# Patient Record
Sex: Female | Born: 1955 | Race: White | Hispanic: No | Smoking: Former smoker
Health system: Southern US, Community
[De-identification: ages and names within clinical notes are randomized; demographics above are authoritative.]

## PROBLEM LIST (undated history)

## (undated) DIAGNOSIS — D649 Anemia, unspecified: Secondary | ICD-10-CM

## (undated) DIAGNOSIS — J449 Chronic obstructive pulmonary disease, unspecified: Secondary | ICD-10-CM

## (undated) DIAGNOSIS — R195 Other fecal abnormalities: Secondary | ICD-10-CM

## (undated) DIAGNOSIS — N3281 Overactive bladder: Secondary | ICD-10-CM

## (undated) DIAGNOSIS — Z9889 Other specified postprocedural states: Secondary | ICD-10-CM

## (undated) DIAGNOSIS — K589 Irritable bowel syndrome without diarrhea: Secondary | ICD-10-CM

## (undated) DIAGNOSIS — Z8719 Personal history of other diseases of the digestive system: Secondary | ICD-10-CM

## (undated) DIAGNOSIS — K219 Gastro-esophageal reflux disease without esophagitis: Secondary | ICD-10-CM

## (undated) DIAGNOSIS — R112 Nausea with vomiting, unspecified: Secondary | ICD-10-CM

## (undated) DIAGNOSIS — E041 Nontoxic single thyroid nodule: Secondary | ICD-10-CM

## (undated) DIAGNOSIS — Z87891 Personal history of nicotine dependence: Secondary | ICD-10-CM

## (undated) HISTORY — PX: SPINE SURGERY: SHX786

---

## 2011-11-14 ENCOUNTER — Ambulatory Visit: Payer: Self-pay | Admitting: Family Medicine

## 2014-09-27 ENCOUNTER — Ambulatory Visit: Payer: Self-pay | Admitting: Physician Assistant

## 2014-12-05 ENCOUNTER — Ambulatory Visit: Payer: Self-pay | Admitting: Internal Medicine

## 2016-02-13 ENCOUNTER — Other Ambulatory Visit: Payer: Self-pay | Admitting: Nurse Practitioner

## 2016-02-13 DIAGNOSIS — Z1231 Encounter for screening mammogram for malignant neoplasm of breast: Secondary | ICD-10-CM

## 2016-02-13 DIAGNOSIS — R1011 Right upper quadrant pain: Secondary | ICD-10-CM

## 2016-02-28 ENCOUNTER — Ambulatory Visit
Admission: RE | Admit: 2016-02-28 | Discharge: 2016-02-28 | Disposition: A | Discharge status: Home / Self Care | Payer: BLUE CROSS/BLUE SHIELD | Source: Ambulatory Visit | Attending: Nurse Practitioner | Admitting: Nurse Practitioner

## 2016-02-28 DIAGNOSIS — Z1231 Encounter for screening mammogram for malignant neoplasm of breast: Secondary | ICD-10-CM | POA: Diagnosis not present

## 2016-02-28 DIAGNOSIS — R1011 Right upper quadrant pain: Secondary | ICD-10-CM | POA: Diagnosis present

## 2016-02-28 DIAGNOSIS — K8020 Calculus of gallbladder without cholecystitis without obstruction: Secondary | ICD-10-CM | POA: Insufficient documentation

## 2016-03-21 ENCOUNTER — Encounter: Payer: Self-pay | Admitting: *Deleted

## 2016-03-21 ENCOUNTER — Other Ambulatory Visit: Payer: BLUE CROSS/BLUE SHIELD

## 2016-03-21 NOTE — Patient Instructions (Signed)
  Your procedure is scheduled on: 03-30-16 Report to Same Day Surgery 2nd floor medical mall To find out your arrival time please call [phone] between 1PM - 3PM on 03-29-16  Remember: Instructions that are not followed completely may result in serious medical risk, up to and including death, or upon the discretion of your surgeon and anesthesiologist your surgery may need to be rescheduled.    _x___ 1. Do not eat food or drink liquids after midnight. No gum chewing or hard candies.     __x__ 2. No Alcohol for 24 hours before or after surgery.   __x__3. No Smoking for 24 prior to surgery.   ____  4. Bring all medications with you on the day of surgery if instructed.    __x__ 5. Notify your doctor if there is any change in your medical condition     (cold, fever, infections).     Do not wear jewelry, make-up, hairpins, clips or nail polish.  Do not wear lotions, powders, or perfumes. You may wear deodorant.  Do not shave 48 hours prior to surgery. Men may shave face and neck.  Do not bring valuables to the hospital.    [facility] is not responsible for any belongings or valuables.               Contacts, dentures or bridgework may not be worn into surgery.  Leave your suitcase in the car. After surgery it may be brought to your room.  For patients admitted to the hospital, discharge time is determined by your treatment team.   Patients discharged the day of surgery will not be allowed to drive home.    Please read over the following fact sheets that you were given:   [facility] Pre[facility] and or MRSA Information   _x___ Take these medicines the morning of surgery with A SIP OF WATER:    1. PEPCID  2.  3.  4.  5.  6.  ____ Fleet Enema (as directed)   _x___ Use CHG Soap or sage wipes as directed on instruction sheet   ____ Use inhalers on the day of surgery and bring to hospital day of surgery  ____ Stop metformin 2 days prior to surgery    ____ Take 1/2  of usual insulin dose the night before surgery and none on the morning of surgery.   ____ Stop aspirin or coumadin, or plavix  _x__ Stop Anti-inflammatories such as Advil, Aleve, Ibuprofen, Motrin, Naproxen,          Naprosyn, Goodies powders or aspirin products. Ok to take Tylenol.   ____ Stop supplements until after surgery.    ____ Bring C-Pap to the hospital.

## 2016-03-30 ENCOUNTER — Encounter: Payer: Self-pay | Admitting: *Deleted

## 2016-03-30 ENCOUNTER — Ambulatory Visit: Payer: BLUE CROSS/BLUE SHIELD | Admitting: Anesthesiology

## 2016-03-30 ENCOUNTER — Ambulatory Visit: Payer: BLUE CROSS/BLUE SHIELD

## 2016-03-30 ENCOUNTER — Encounter
Admission: RE | Disposition: A | Discharge status: Home / Self Care | Payer: Self-pay | Source: Ambulatory Visit | Attending: Surgery

## 2016-03-30 ENCOUNTER — Ambulatory Visit
Admission: RE | Admit: 2016-03-30 | Discharge: 2016-03-30 | Disposition: A | Discharge status: Home / Self Care | Payer: BLUE CROSS/BLUE SHIELD | Source: Ambulatory Visit | Attending: Surgery | Admitting: Surgery

## 2016-03-30 DIAGNOSIS — K8010 Calculus of gallbladder with chronic cholecystitis without obstruction: Secondary | ICD-10-CM | POA: Insufficient documentation

## 2016-03-30 DIAGNOSIS — Z87891 Personal history of nicotine dependence: Secondary | ICD-10-CM | POA: Insufficient documentation

## 2016-03-30 DIAGNOSIS — J449 Chronic obstructive pulmonary disease, unspecified: Secondary | ICD-10-CM | POA: Insufficient documentation

## 2016-03-30 DIAGNOSIS — K589 Irritable bowel syndrome without diarrhea: Secondary | ICD-10-CM | POA: Diagnosis not present

## 2016-03-30 DIAGNOSIS — K8020 Calculus of gallbladder without cholecystitis without obstruction: Secondary | ICD-10-CM

## 2016-03-30 DIAGNOSIS — G709 Myoneural disorder, unspecified: Secondary | ICD-10-CM | POA: Insufficient documentation

## 2016-03-30 DIAGNOSIS — K219 Gastro-esophageal reflux disease without esophagitis: Secondary | ICD-10-CM | POA: Insufficient documentation

## 2016-03-30 DIAGNOSIS — Z862 Personal history of diseases of the blood and blood-forming organs and certain disorders involving the immune mechanism: Secondary | ICD-10-CM | POA: Diagnosis not present

## 2016-03-30 HISTORY — DX: Gastro-esophageal reflux disease without esophagitis: K21.9

## 2016-03-30 HISTORY — PX: CHOLECYSTECTOMY: SHX55

## 2016-03-30 HISTORY — DX: Anemia, unspecified: D64.9

## 2016-03-30 HISTORY — DX: Irritable bowel syndrome, unspecified: K58.9

## 2016-03-30 HISTORY — DX: Overactive bladder: N32.81

## 2016-03-30 SURGERY — LAPAROSCOPIC CHOLECYSTECTOMY WITH INTRAOPERATIVE CHOLANGIOGRAM
Anesthesia: General | Site: Abdomen | Wound class: Clean Contaminated

## 2016-03-30 MED ORDER — FENTANYL CITRATE (PF) 100 MCG/2ML IJ SOLN
INTRAMUSCULAR | Status: DC | PRN
  Administered 2016-03-30: 100 ug via INTRAVENOUS
  Administered 2016-03-30: 50 ug via INTRAVENOUS

## 2016-03-30 MED ORDER — LIDOCAINE HCL (CARDIAC) 20 MG/ML IV SOLN
INTRAVENOUS | Status: DC | PRN
  Administered 2016-03-30: 60 mg via INTRAVENOUS

## 2016-03-30 MED ORDER — ROCURONIUM BROMIDE 100 MG/10ML IV SOLN
INTRAVENOUS | Status: DC | PRN
  Administered 2016-03-30: 40 mg via INTRAVENOUS

## 2016-03-30 MED ORDER — ONDANSETRON HCL 4 MG/2ML IJ SOLN
INTRAMUSCULAR | Status: DC | PRN
  Administered 2016-03-30: 4 mg via INTRAVENOUS

## 2016-03-30 MED ORDER — PHENYLEPHRINE HCL 10 MG/ML IJ SOLN
INTRAMUSCULAR | Status: DC | PRN
  Administered 2016-03-30: 100 ug via INTRAVENOUS
  Administered 2016-03-30: 50 ug via INTRAVENOUS
  Administered 2016-03-30 (×4): 100 ug via INTRAVENOUS

## 2016-03-30 MED ORDER — HYDROCODONE-ACETAMINOPHEN 5-325 MG PO TABS: 1.0000 | ORAL_TABLET | ORAL | Status: DC | PRN

## 2016-03-30 MED ORDER — HYDROCODONE-ACETAMINOPHEN 5-325 MG PO TABS
ORAL_TABLET | ORAL | Status: DC
Start: 2016-03-30 — End: 2016-03-30
  Filled 2016-03-30: qty 2

## 2016-03-30 MED ORDER — KETOROLAC TROMETHAMINE 30 MG/ML IJ SOLN
INTRAMUSCULAR | Status: DC | PRN
  Administered 2016-03-30: 30 mg via INTRAVENOUS

## 2016-03-30 MED ORDER — ONDANSETRON HCL 4 MG/2ML IJ SOLN
INTRAMUSCULAR | Status: AC
  Filled 2016-03-30: qty 2

## 2016-03-30 MED ORDER — LACTATED RINGERS IV SOLN
INTRAVENOUS | Status: DC
  Administered 2016-03-30 (×2): via INTRAVENOUS

## 2016-03-30 MED ORDER — MIDAZOLAM HCL 5 MG/5ML IJ SOLN
INTRAMUSCULAR | Status: DC | PRN
  Administered 2016-03-30: 2 mg via INTRAVENOUS

## 2016-03-30 MED ORDER — PROPOFOL 10 MG/ML IV BOLUS
INTRAVENOUS | Status: DC | PRN
  Administered 2016-03-30: 150 mg via INTRAVENOUS

## 2016-03-30 MED ORDER — FENTANYL CITRATE (PF) 100 MCG/2ML IJ SOLN
25.0000 ug | INTRAMUSCULAR | Status: DC | PRN
  Administered 2016-03-30 (×4): 25 ug via INTRAVENOUS

## 2016-03-30 MED ORDER — HEPARIN SODIUM (PORCINE) 5000 UNIT/ML IJ SOLN
INTRAMUSCULAR | Status: AC
  Filled 2016-03-30: qty 1

## 2016-03-30 MED ORDER — FENTANYL CITRATE (PF) 100 MCG/2ML IJ SOLN
INTRAMUSCULAR | Status: AC
  Administered 2016-03-30: 25 ug via INTRAVENOUS
  Filled 2016-03-30: qty 2

## 2016-03-30 MED ORDER — ONDANSETRON HCL 4 MG/2ML IJ SOLN
4.0000 mg | Freq: Once | INTRAMUSCULAR | Status: AC | PRN
  Administered 2016-03-30: 4 mg via INTRAVENOUS

## 2016-03-30 MED ORDER — HYDROCODONE-ACETAMINOPHEN 5-325 MG PO TABS
1.0000 | ORAL_TABLET | ORAL | Status: DC | PRN
  Administered 2016-03-30: 2 via ORAL

## 2016-03-30 MED ORDER — SUGAMMADEX SODIUM 200 MG/2ML IV SOLN
INTRAVENOUS | Status: DC | PRN
  Administered 2016-03-30: 120 mg via INTRAVENOUS

## 2016-03-30 MED ORDER — SODIUM CHLORIDE 0.9 % IV SOLN
INTRAVENOUS | Status: DC | PRN
  Administered 2016-03-30: 1 mL via INTRAMUSCULAR

## 2016-03-30 MED ORDER — SODIUM CHLORIDE 0.9 % IV SOLN
INTRAVENOUS | Status: DC | PRN
  Administered 2016-03-30: 14 mL

## 2016-03-30 MED ORDER — DEXAMETHASONE SODIUM PHOSPHATE 10 MG/ML IJ SOLN
INTRAMUSCULAR | Status: DC | PRN
  Administered 2016-03-30: 5 mg via INTRAVENOUS

## 2016-03-30 SURGICAL SUPPLY — 37 items
APPLIER CLIP ROT 10 11.4 M/L (STAPLE) ×3
CANISTER SUCT 1200ML W/VALVE (MISCELLANEOUS) ×3 IMPLANT
CANNULA DILATOR 10 W/SLV (CANNULA) ×2 IMPLANT
CANNULA DILATOR 10MM W/SLV (CANNULA) ×1
CATH REDDICK CHOLANGI 4FR 50CM (CATHETERS) ×3 IMPLANT
CHLORAPREP W/TINT 26ML (MISCELLANEOUS) ×3 IMPLANT
CLIP APPLIE ROT 10 11.4 M/L (STAPLE) ×1 IMPLANT
CLOSURE WOUND 1/2 X4 (GAUZE/BANDAGES/DRESSINGS) ×1
DRAPE SHEET LG 3/4 BI-LAMINATE (DRAPES) ×3 IMPLANT
ELECT REM PT RETURN 9FT ADLT (ELECTROSURGICAL) ×3
ELECTRODE REM PT RTRN 9FT ADLT (ELECTROSURGICAL) ×1 IMPLANT
GAUZE SPONGE 4X4 12PLY STRL (GAUZE/BANDAGES/DRESSINGS) ×3 IMPLANT
GLOVE BIO SURGEON STRL SZ7.5 (GLOVE) ×3 IMPLANT
GOWN STRL REUS W/ TWL LRG LVL3 (GOWN DISPOSABLE) ×4 IMPLANT
GOWN STRL REUS W/TWL LRG LVL3 (GOWN DISPOSABLE) ×8
IRRIGATION STRYKERFLOW (MISCELLANEOUS) ×1 IMPLANT
IRRIGATOR STRYKERFLOW (MISCELLANEOUS) ×3
IV NS 1000ML (IV SOLUTION) ×2
IV NS 1000ML BAXH (IV SOLUTION) ×1 IMPLANT
KIT RM TURNOVER STRD PROC AR (KITS) ×3 IMPLANT
LABEL OR SOLS (LABEL) ×3 IMPLANT
NDL INSUFF ACCESS 14 VERSASTEP (NEEDLE) ×3 IMPLANT
NEEDLE FILTER BLUNT 18X 1/2SAF (NEEDLE) ×2
NEEDLE FILTER BLUNT 18X1 1/2 (NEEDLE) ×1 IMPLANT
NS IRRIG 500ML POUR BTL (IV SOLUTION) ×3 IMPLANT
PACK LAP CHOLECYSTECTOMY (MISCELLANEOUS) ×3 IMPLANT
SCISSORS METZENBAUM CVD 33 (INSTRUMENTS) ×3 IMPLANT
SEAL FOR SCOPE WARMER C3101 (MISCELLANEOUS) ×3 IMPLANT
SLEEVE ENDOPATH XCEL 5M (ENDOMECHANICALS) ×3 IMPLANT
STRIP CLOSURE SKIN 1/2X4 (GAUZE/BANDAGES/DRESSINGS) ×2 IMPLANT
SUT CHROMIC 5 0 RB 1 27 (SUTURE) ×3 IMPLANT
SUT VIC AB 0 CT2 27 (SUTURE) IMPLANT
SYR 3ML LL SCALE MARK (SYRINGE) ×3 IMPLANT
TROCAR XCEL NON-BLD 11X100MML (ENDOMECHANICALS) ×3 IMPLANT
TROCAR XCEL NON-BLD 5MMX100MML (ENDOMECHANICALS) ×3 IMPLANT
TUBING INSUFFLATOR HI FLOW (MISCELLANEOUS) ×3 IMPLANT
WATER STERILE IRR 1000ML POUR (IV SOLUTION) ×3 IMPLANT

## 2016-03-30 NOTE — H&P (Signed)
  She reports no change in condition since the day of the office exam.  Lab work was reviewed.  I discussed her history of epigastric pain and findings of gallstones. Plan is for laparoscopic cholecystectomy

## 2016-03-30 NOTE — Op Note (Signed)
OPERATIVE REPORT  PREOPERATIVE DIAGNOSIS:  Chronic cholecystitis cholelithiasis  POSTOPERATIVE DIAGNOSIS: Chronic cholecystitis cholelithiasis  PROCEDURE: Laparoscopic cholecystectomy with cholangiogram  ANESTHESIA: General  SURGEON: Renda RollsWilton Smith M.D.  INDICATIONS: She has history of epigastric pains and ultrasound findings of gallstones. Surgery was recommended for definitive treatment.    With the patient on the operating table in the supine position under general endotracheal anesthesia the abdomen was prepared with ChloraPrep solution and draped in a sterile manner. A short incision was made in the inferior aspect of the umbilicus and carried down to the deep fascia which was grasped with a laryngeal hook. A Veress needle was inserted aspirated and irrigated with a saline solution. The peritoneal cavity was insufflated with carbon dioxide. The Veress needle was removed. The 10 mm cannula was inserted. The 10 mm 0 laparoscope was inserted to view the peritoneal cavity.  Another incision was made in the epigastrium slightly to the right of the midline to introduce an 11 mm cannula. 2 incisions were made in the lateral aspect of the right upper quadrant to introduce 2   5 mm cannulas.  The gallbladder was retracted towards the right shoulder. Multiple adhesions were taken down with blunt and sharp dissection.  The gallbladder neck was retracted inferiorly and laterally.  The porta hepatis was identified. The gallbladder was mobilized with incision of the visceral peritoneum. The cystic duct was dissected free from surrounding structures. The cystic artery was dissected free from surrounding structures. A critical view of safety was demonstrated  An Endo Clip was placed across the cystic duct adjacent to the gallbladder neck. An incision was made in the cystic duct to introduce a Reddick catheter. The cholangiogram was done with injection of half-strength Conray 60 dye. This demonstrated the bile  ducts and flow of dye into the duodenum. No retained stones were identified. The cholangiogram appeared normal. The Reddick catheter was removed. The cystic duct was doubly ligated with endoclips and divided. The cystic artery was controlled with double endoclips and divided. The gallbladder was dissected free from the liver with use of hook and cautery and blunt dissection. Bleeding was minimal and hemostasis was intact. The gallbladder was delivered up through the infraumbilical incision opened and suctioned and removed. There were several small palpable stones. The gallbladder was submitted in formalin for routine pathology.The skin incisions were closed with interrupted 5-0 chromic subcutaneous suture benzoin and Steri-Strips. Gauze dressings were applied with paper tape.  The patient appeared to be in satisfactory condition and was prepared for transfer to the recovery room  Renda RollsWilton Smith M.D.

## 2016-03-30 NOTE — Transfer of Care (Signed)
Immediate Anesthesia Transfer of Care Note  Patient: Adriana Howard  Procedure(s) Performed: Procedure(s): LAPAROSCOPIC CHOLECYSTECTOMY WITH INTRAOPERATIVE CHOLANGIOGRAM (N/A)  Patient Location: PACU  Anesthesia Type:General  Level of Consciousness: sedated  Airway & Oxygen Therapy: Patient Spontanous Breathing and Patient connected to face mask oxygen  Post-op Assessment: Report given to RN  Post vital signs: Reviewed and stable  Last Vitals:  Filed Vitals:   03/30/16 1443 03/30/16 1740  BP: 135/89 112/63  Pulse: 79 77  Temp: 36.7 C 36.8 C  Resp: 18 16    Last Pain:  Filed Vitals:   03/30/16 1741  PainSc: 0-No pain         Complications: No apparent anesthesia complications

## 2016-03-30 NOTE — Discharge Instructions (Signed)
Take Tylenol or Norco if needed for pain.  Remove dressings Saturday. May shower Sunday.  Avoid straining and heavy lifting during the first week after surgery.  Gradually resume usual activities.   AMBULATORY SURGERY  DISCHARGE INSTRUCTIONS   1) The drugs that you were given will stay in your system until tomorrow so for the next 24 hours you should not:  A) Drive an automobile B) Make any legal decisions C) Drink any alcoholic beverage   2) You may resume regular meals tomorrow.  Today it is better to start with liquids and gradually work up to solid foods.  You may eat anything you prefer, but it is better to start with liquids, then soup and crackers, and gradually work up to solid foods.   3) Please notify your doctor immediately if you have any unusual bleeding, trouble breathing, redness and pain at the surgery site, drainage, fever, or pain not relieved by medication.    4) Additional Instructions:        Please contact your physician with any problems or Same Day Surgery at [phone], Monday through Friday 6 am to 4 pm, or [place] at [facility] Main number at [phone].

## 2016-03-30 NOTE — Anesthesia Procedure Notes (Signed)
Procedure Name: Intubation Date/Time: 03/30/2016 4:14 PM Performed by: Lily KocherPERALTA, Santa Abdelrahman Pre-anesthesia Checklist: Patient identified, Patient being monitored, Timeout performed, Emergency Drugs available and Suction available Patient Re-evaluated:Patient Re-evaluated prior to inductionOxygen Delivery Method: Circle system utilized Preoxygenation: Pre-oxygenation with 100% oxygen Intubation Type: IV induction Ventilation: Mask ventilation without difficulty Laryngoscope Size: Mac and 3 Grade View: Grade I Tube type: Oral Tube size: 7.0 mm Number of attempts: 1 Airway Equipment and Method: Stylet Placement Confirmation: ETT inserted through vocal cords under direct vision,  positive ETCO2 and breath sounds checked- equal and bilateral Secured at: 20 cm Tube secured with: Tape Dental Injury: Teeth and Oropharynx as per pre-operative assessment

## 2016-03-30 NOTE — Anesthesia Postprocedure Evaluation (Signed)
Anesthesia Post Note  Patient: Adriana Howard  Procedure(s) Performed: Procedure(s) (LRB): LAPAROSCOPIC CHOLECYSTECTOMY WITH INTRAOPERATIVE CHOLANGIOGRAM (N/A)  Patient location during evaluation: PACU Anesthesia Type: General Level of consciousness: awake and alert Pain management: pain level controlled Vital Signs Assessment: post-procedure vital signs reviewed and stable Respiratory status: spontaneous breathing, nonlabored ventilation, respiratory function stable and patient connected to nasal cannula oxygen Cardiovascular status: blood pressure returned to baseline and stable Postop Assessment: no signs of nausea or vomiting Anesthetic complications: no    Last Vitals:  Filed Vitals:   03/30/16 1838 03/30/16 1901  BP: 114/77 108/78  Pulse: 75 77  Temp: 36.8 C 36.9 C  Resp: 18 16    Last Pain:  Filed Vitals:   03/30/16 1902  PainSc: 3                  Cleda MccreedyJoseph K Piscitello

## 2016-03-30 NOTE — Anesthesia Preprocedure Evaluation (Addendum)
Anesthesia Evaluation  Patient identified by MRN, date of birth, ID band Patient awake    Reviewed: Allergy & Precautions, NPO status , Patient's Chart, lab work & pertinent test results, reviewed documented beta blocker date and time   Airway Mallampati: III  TM Distance: <3 FB Neck ROM: limited    Dental  (+) Chipped   Pulmonary COPD, former smoker,           Cardiovascular      Neuro/Psych  Neuromuscular disease    GI/Hepatic GERD  Controlled,  Endo/Other    Renal/GU      Musculoskeletal   Abdominal   Peds  Hematology  (+) anemia ,   Anesthesia Other Findings Past Medical History:   GERD (gastroesophageal reflux disease)                       Overactive bladder                                           IBS (irritable bowel syndrome)                               Anemia                                                         Comment: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     Plan Discussed with: CRNA  Anesthesia Plan Comments:       Anesthesia Quick Evaluation

## 2016-04-02 ENCOUNTER — Encounter: Payer: Self-pay | Admitting: Surgery

## 2016-04-04 LAB — SURGICAL PATHOLOGY

## 2016-05-06 ENCOUNTER — Emergency Department: Payer: BLUE CROSS/BLUE SHIELD

## 2016-05-06 ENCOUNTER — Encounter: Payer: Self-pay | Admitting: Emergency Medicine

## 2016-05-06 ENCOUNTER — Emergency Department
Admission: EM | Admit: 2016-05-06 | Discharge: 2016-05-07 | Disposition: A | Discharge status: Home / Self Care | Payer: BLUE CROSS/BLUE SHIELD | Attending: Emergency Medicine | Admitting: Emergency Medicine

## 2016-05-06 DIAGNOSIS — Z79899 Other long term (current) drug therapy: Secondary | ICD-10-CM | POA: Diagnosis not present

## 2016-05-06 DIAGNOSIS — R112 Nausea with vomiting, unspecified: Secondary | ICD-10-CM | POA: Insufficient documentation

## 2016-05-06 DIAGNOSIS — R52 Pain, unspecified: Secondary | ICD-10-CM

## 2016-05-06 DIAGNOSIS — R1030 Lower abdominal pain, unspecified: Secondary | ICD-10-CM | POA: Diagnosis not present

## 2016-05-06 DIAGNOSIS — Z87891 Personal history of nicotine dependence: Secondary | ICD-10-CM | POA: Diagnosis not present

## 2016-05-06 LAB — COMPREHENSIVE METABOLIC PANEL
ALK PHOS: 84 U/L (ref 38–126)
ALT: 13 U/L — ABNORMAL LOW (ref 14–54)
ANION GAP: 7 (ref 5–15)
AST: 17 U/L (ref 15–41)
Albumin: 4.1 g/dL (ref 3.5–5.0)
BILIRUBIN TOTAL: 0.4 mg/dL (ref 0.3–1.2)
BUN: 19 mg/dL (ref 6–20)
CALCIUM: 9 mg/dL (ref 8.9–10.3)
CO2: 25 mmol/L (ref 22–32)
Chloride: 106 mmol/L (ref 101–111)
Creatinine, Ser: 0.95 mg/dL (ref 0.44–1.00)
GFR calc non Af Amer: >60 mL/min (ref >60)
Glucose, Bld: 114 mg/dL — ABNORMAL HIGH (ref 65–99)
POTASSIUM: 3.8 mmol/L (ref 3.5–5.1)
SODIUM: 138 mmol/L (ref 135–145)
TOTAL PROTEIN: 7.4 g/dL (ref 6.5–8.1)

## 2016-05-06 LAB — CBC
HEMATOCRIT: 38 % (ref 35.0–47.0)
HEMOGLOBIN: 13 g/dL (ref 12.0–16.0)
MCH: 31.8 pg (ref 26.0–34.0)
MCHC: 34.2 g/dL (ref 32.0–36.0)
MCV: 92.9 fL (ref 80.0–100.0)
Platelets: 233 10*3/uL (ref 150–440)
RBC: 4.09 MIL/uL (ref 3.80–5.20)
RDW: 14.2 % (ref 11.5–14.5)
WBC: 7.2 10*3/uL (ref 3.6–11.0)

## 2016-05-06 LAB — TROPONIN I

## 2016-05-06 LAB — URINALYSIS COMPLETE WITH MICROSCOPIC (ARMC ONLY)
BILIRUBIN URINE: NEGATIVE
Bacteria, UA: NONE SEEN
GLUCOSE, UA: NEGATIVE mg/dL
Hgb urine dipstick: NEGATIVE
Ketones, ur: NEGATIVE mg/dL
Leukocytes, UA: NEGATIVE
NITRITE: NEGATIVE
Protein, ur: NEGATIVE mg/dL
Specific Gravity, Urine: 1.025 (ref 1.005–1.030)
Squamous Epithelial / LPF: NONE SEEN
pH: 6 (ref 5.0–8.0)

## 2016-05-06 NOTE — ED Provider Notes (Signed)
[facility] Emergency Department Provider Note   ____________________________________________   First MD Initiated Contact with Patient 05/06/16 2106     (approximate)  I have reviewed the triage vital signs and the nursing notes.   HISTORY  Chief Complaint Abdominal Pain and Emesis    HPI Adriana Howard is a 60 y.o. female patient reports she had her gallbladder out about 6 weeks ago. She had heartburn last night and then tonight began having sharp pains suprapubically in the abdomen and vomited once and now feels better. Last week she fell and was feeling okay. Last couple days that she's been developing some low back pain. This low-back pain is achy but not severe.   Past Medical History:  Diagnosis Date  . 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 GERD (gastroesophageal reflux disease)   . IBS (irritable bowel syndrome)   . Overactive bladder     There are no active problems to display for this patient.   Past Surgical History:  Procedure Laterality Date  . CHOLECYSTECTOMY N/A 03/30/2016   Procedure: LAPAROSCOPIC CHOLECYSTECTOMY WITH INTRAOPERATIVE CHOLANGIOGRAM;  Surgeon: Nadeen LandauJarvis Wilton Smith, MD;  Location: [facility] ORS;  Service: General;  Laterality: N/A;  . SPINE SURGERY     to remove a benign tumor    Prior to Admission medications   Medication Sig Start Date End Date Taking? Authorizing Provider  acetaminophen (TYLENOL) 500 MG tablet Take 1,000 mg by mouth every 6 (six) hours as needed for mild pain.   Yes Historical Provider, MD  cholestyramine (QUESTRAN) 4 g packet Take 4 g by mouth daily. 04/18/16  Yes Historical Provider, MD  famotidine (PEPCID) 20 MG tablet Take 20 mg by mouth 2 (two) times daily.   Yes Historical Provider, MD  oxybutynin (DITROPAN) 5 MG tablet Take 5 mg by mouth 3 (three) times daily. 05/26/15 05/25/16 Yes Historical Provider, MD  sucralfate (CARAFATE) 1 g tablet Take 1 g by mouth 3  (three) times daily.   Yes Historical Provider, MD    Allergies Penicillin g  Family History  Problem Relation Age of Onset  . Breast cancer Mother 8660    Social History Social History  Substance Use Topics  . Smoking status: Former Smoker    Packs/day: 1.50    Years: 20.00    Types: Cigarettes    Quit date: 03/21/1986  . Smokeless tobacco: Never Used  . Alcohol use No    Review of Systems Constitutional: No fever/chills Eyes: No visual changes. ENT: No sore throat. Cardiovascular: Denies chest pain. Respiratory: Denies shortness of breath. Gastrointestinal:See history of present illness Genitourinary: Negative for dysuria. Musculoskeletal: See history of present illness. Skin: Negative for rash. Neurological: Negative for headaches, focal weakness or numbness.  10-point ROS otherwise negative.  ____________________________________________   PHYSICAL EXAM:  VITAL SIGNS: ED Triage Vitals  Enc Vitals Group     BP 05/06/16 2044 131/83     Pulse Rate 05/06/16 2044 72     Resp 05/06/16 2044 16     Temp 05/06/16 2044 98.2 F (36.8 C)     Temp Source 05/06/16 2044 Oral     SpO2 05/06/16 2044 100 %     Weight 05/06/16 2044 130 lb (59 kg)     Height 05/06/16 2044 5\' 7"  (1.702 m)     Head Circumference --      Peak Flow --      Pain Score 05/06/16 2045 0  Pain Loc --      Pain Edu? --      Excl. in GC? --     Constitutional: Alert and oriented. Well appearing and in no acute distress. Eyes: Conjunctivae are normal. PERRL. EOMI. Head: Atraumatic. Nose: No congestion/rhinnorhea. Mouth/Throat: Mucous membranes are moist.  Oropharynx non-erythematous. Neck: No stridor. Cardiovascular: Normal rate, regular rhythm. Grossly normal heart sounds.  Good peripheral circulation. Respiratory: Normal respiratory effort.  No retractions. Lungs CTAB. Gastrointestinal: Soft and nontender. No distention. No abdominal bruits. No CVA tenderness. Musculoskeletal: No lower  extremity tenderness nor edema.  No joint effusions. Neurologic:  Normal speech and language. No gross focal neurologic deficits are appreciated. No gait instability. Skin:  Skin is warm, dry and intact. No rash noted. Psychiatric: Mood and affect are normal. Speech and behavior are normal.  ____________________________________________   LABS (all labs ordered are listed, but only abnormal results are displayed)  Labs Reviewed  COMPREHENSIVE METABOLIC PANEL - Abnormal; Notable for the following:       Result Value   Glucose, Bld 114 (*)    ALT 13 (*)    All other components within normal limits  URINALYSIS COMPLETEWITH MICROSCOPIC ([facility] ONLY) - Abnormal; Notable for the following:    Color, Urine YELLOW (*)    APPearance CLEAR (*)    All other components within normal limits  CBC  TROPONIN I   ____________________________________________  EKG   ____________________________________________  RADIOLOGY   ____CLINICAL DATA:  Acute onset of lower abdominal pain and vomiting. Initial encounter.  EXAM: CT ABDOMEN AND PELVIS WITHOUT CONTRAST  TECHNIQUE: Multidetector CT imaging of the abdomen and pelvis was performed following the standard protocol without IV contrast.  COMPARISON:  Right upper quadrant ultrasound performed 02/28/2016  FINDINGS: The visualized lung bases are clear.  The liver and spleen are unremarkable in appearance. The patient is status post cholecystectomy, with clips noted at the gallbladder fossa. The pancreas and adrenal glands are unremarkable.  The kidneys are unremarkable in appearance. There is no evidence of hydronephrosis. No renal or ureteral stones are seen. No perinephric stranding is appreciated.  No free fluid is identified. The small bowel is unremarkable in appearance. The stomach is within normal limits. No acute vascular abnormalities are seen. Mild scattered calcification is seen along the abdominal aorta.  The  appendix is not definitely characterized; there is no evidence for appendicitis. Minimal diverticulosis is noted along the sigmoid colon. The colon is otherwise unremarkable in appearance.  The bladder is mildly distended and grossly unremarkable. The uterus is unremarkable in appearance. The ovaries are grossly symmetric. No suspicious adnexal masses are seen. No inguinal lymphadenopathy is seen. Postoperative change is noted at the left hemipelvis.  No acute osseous abnormalities are identified.  IMPRESSION: 1. No acute abnormality seen within the abdomen or pelvis. 2. Minimal diverticulosis along the sigmoid colon, without evidence of diverticulitis.   Electronically Signed   By: Roanna Raider M.D.   On: 05/06/2016 22:21___  _____CLINICAL DATA:  Crackles at the bilateral lung bases. Patient reports "bad heartburn".  EXAM: CHEST  2 VIEW  COMPARISON:  Chest radiograph 08/28/2014. Included lung bases from CT abdomen/pelvis earlier this day  FINDINGS: Mild hyperinflation. The cardiomediastinal contours are normal. Pulmonary vasculature is normal. No consolidation, pleural effusion, or pneumothorax. No acute osseous abnormalities are seen.  IMPRESSION: Hyperinflation, can be seen with COPD.  No acute process.   Electronically Signed   By: Rubye Oaks M.D.   On: 05/06/2016 22:26________________________________   PROCEDURES  Procedures    ____________________________________________   INITIAL IMPRESSION / ASSESSMENT AND PLAN / ED COURSE  Pertinent labs & imaging results that were available during my care of the patient were reviewed by me and considered in my medical decision making (see chart for details).    Clinical Course   Patient still feels well on discharge  ____________________________________________   FINAL CLINICAL IMPRESSION(S) / ED DIAGNOSES  Final diagnoses:  Pain  Lower abdominal pain      NEW MEDICATIONS  STARTED DURING THIS VISIT:  Discharge Medication List as of 05/07/2016 12:09 AM       Note:  This document was prepared using Dragon voice recognition software and may include unintentional dictation errors.    Arnaldo Natal, MD 05/07/16 [phone]

## 2016-05-06 NOTE — ED Triage Notes (Addendum)
Pt states yesterday had "heartburn", pt states this am she began to have abdominal pain that since this afternoon has localized to lower abd and is accompanied by vomiting and no nausea. Pt states she also has "the feeling like I have to pee and I don't go." pt denies diarrhea and fever.

## 2016-05-07 NOTE — Discharge Instructions (Signed)
Please follow-up with your regular doctor later on this week. Please return if you feel worse again especially for fever or continued vomiting or increased pain.

## 2017-02-18 ENCOUNTER — Other Ambulatory Visit: Payer: Self-pay | Admitting: Nurse Practitioner

## 2017-02-18 DIAGNOSIS — Z1231 Encounter for screening mammogram for malignant neoplasm of breast: Secondary | ICD-10-CM

## 2017-03-11 ENCOUNTER — Ambulatory Visit
Admission: RE | Admit: 2017-03-11 | Discharge: 2017-03-11 | Disposition: A | Discharge status: Home / Self Care | Payer: BLUE CROSS/BLUE SHIELD | Source: Ambulatory Visit | Attending: Nurse Practitioner | Admitting: Nurse Practitioner

## 2017-03-11 DIAGNOSIS — Z1231 Encounter for screening mammogram for malignant neoplasm of breast: Secondary | ICD-10-CM | POA: Diagnosis present

## 2017-10-19 IMAGING — CT CT RENAL STONE PROTOCOL
2 of 4 series · 16 of 46 positions shown, 18 images · non-contrast
Comparison: Right upper quadrant ultrasound performed 02/28/2016

CLINICAL DATA: Acute onset of lower abdominal pain and vomiting.
Initial encounter.

EXAM:
CT ABDOMEN AND PELVIS WITHOUT CONTRAST
TECHNIQUE: Multidetector CT imaging of the abdomen and pelvis was performed
following the standard protocol without IV contrast.

[Series 2: axial st · axial · 0.71mm/px · z∈[-1070,-685]mm · 13 of 85 slices shown, 15 images]
[im 4/85  soft-tissue]
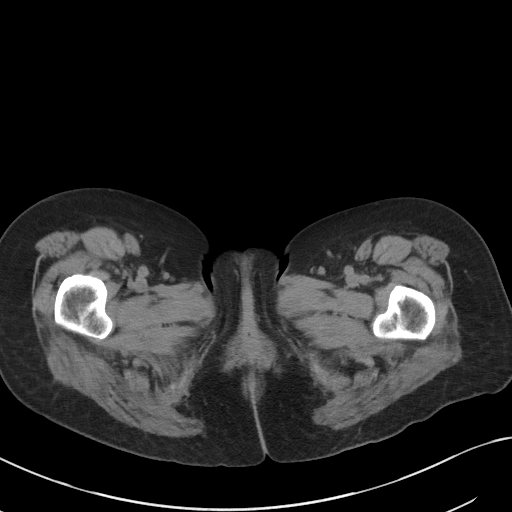
[im 4/85  bone]
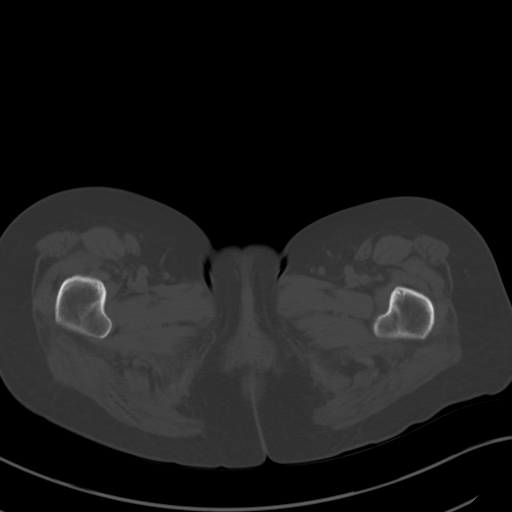
[im 10/85  soft-tissue]
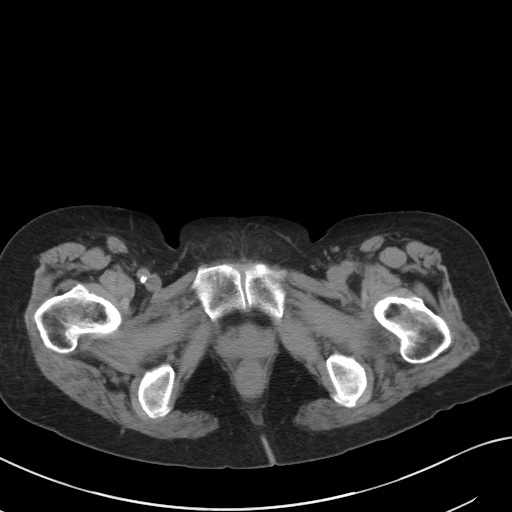
[im 17/85  soft-tissue]
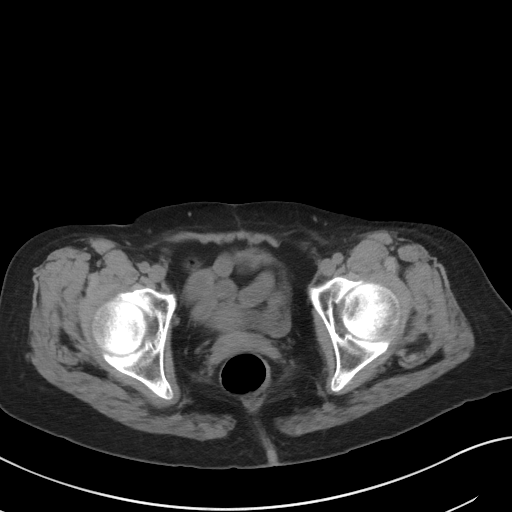
[im 23/85  soft-tissue]
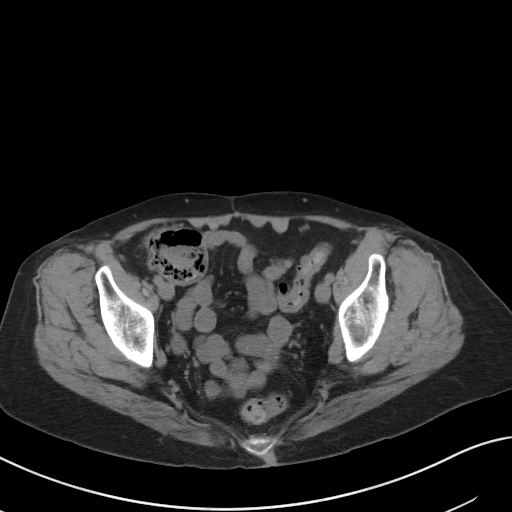
[im 30/85  soft-tissue]
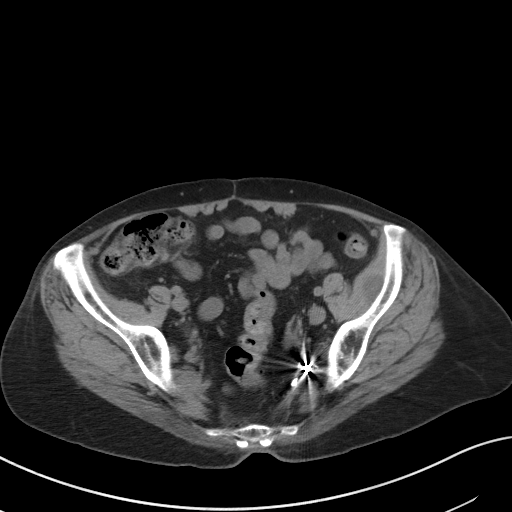
[im 36/85  soft-tissue]
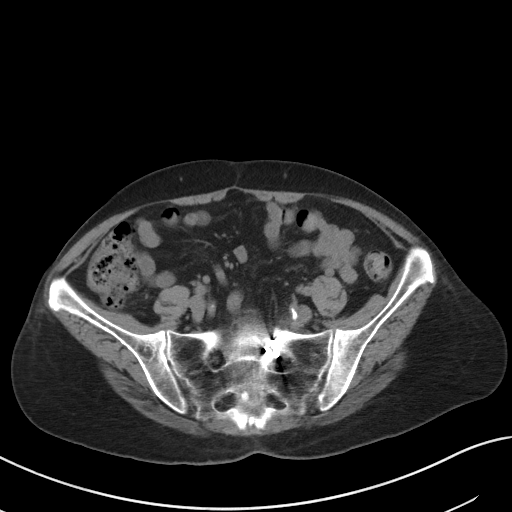
[im 43/85  soft-tissue]
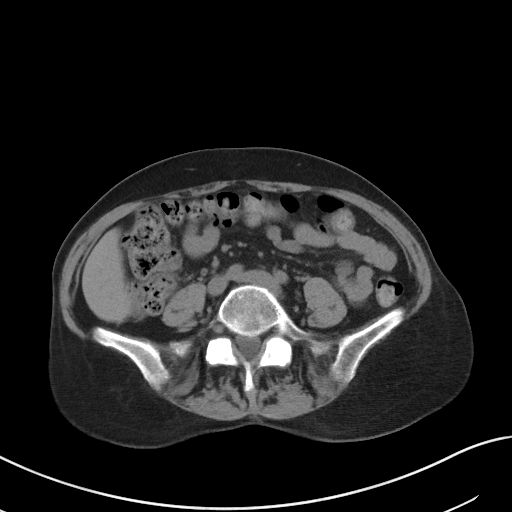
[im 49/85  soft-tissue]
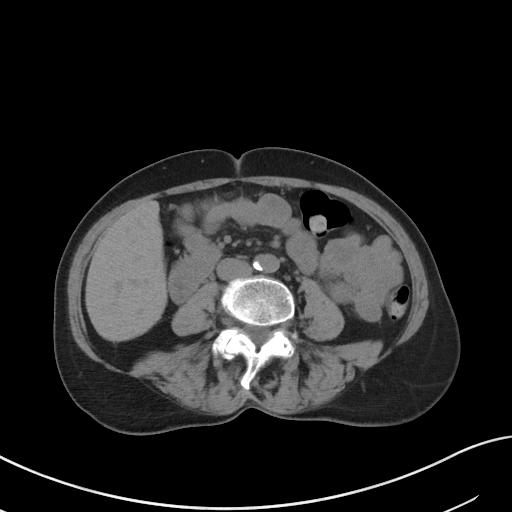
[im 55/85  soft-tissue]
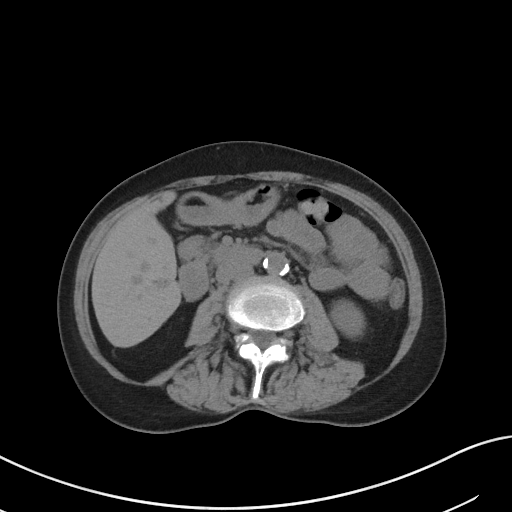
[im 55/85  bone]
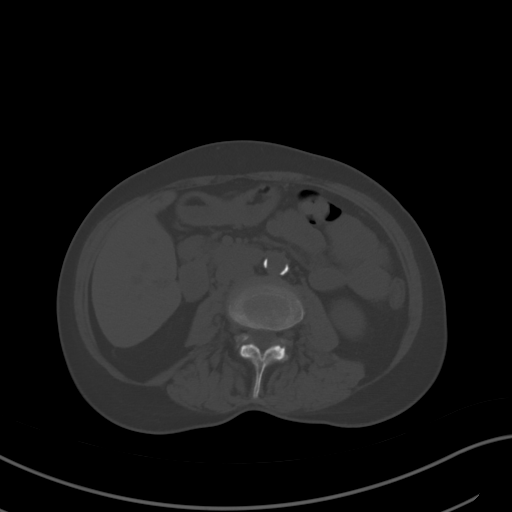
[im 62/85  soft-tissue]
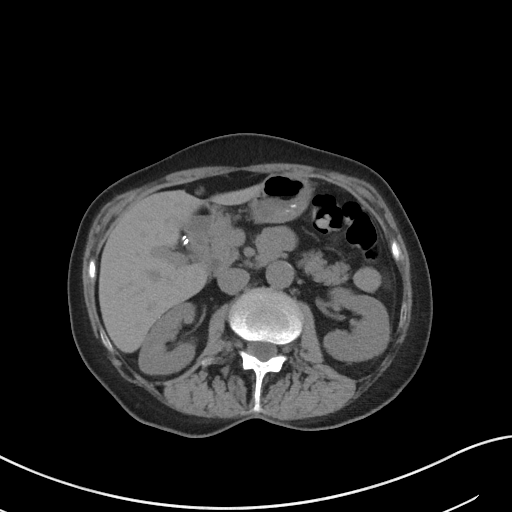
[im 68/85  soft-tissue]
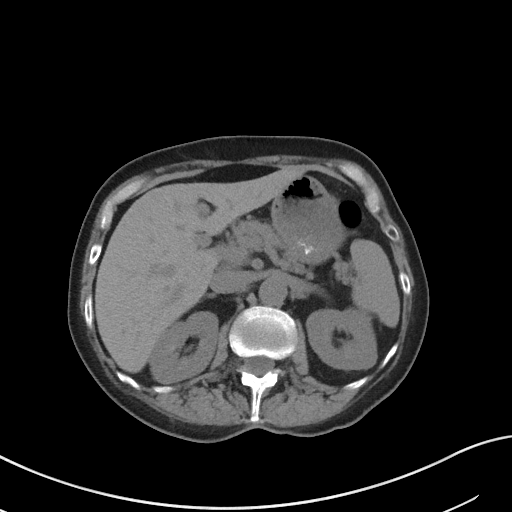
[im 75/85  soft-tissue]
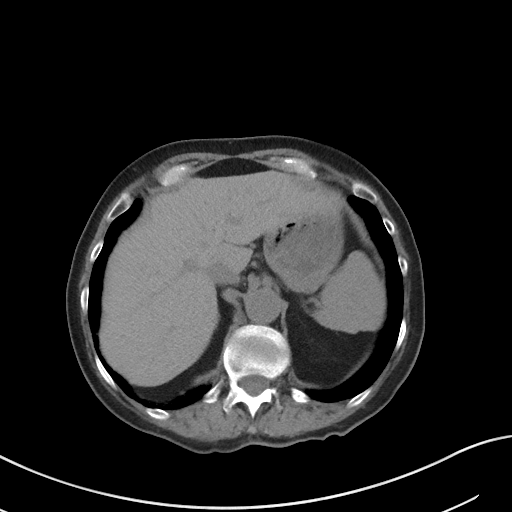
[im 81/85  soft-tissue]
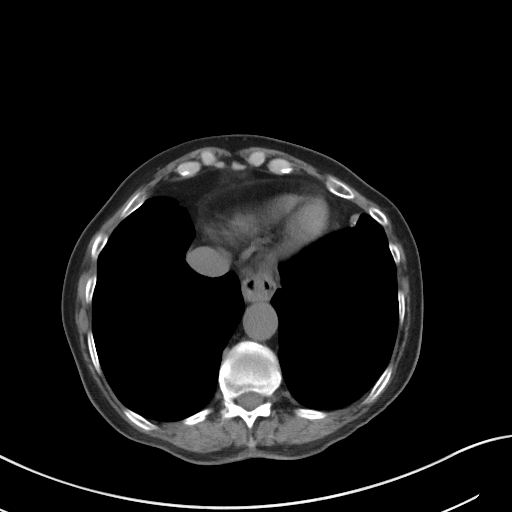

[Series 5: coronal st · coronal · 0.71mm/px · 3 of 76 slices shown]
[im 26/76  soft-tissue]
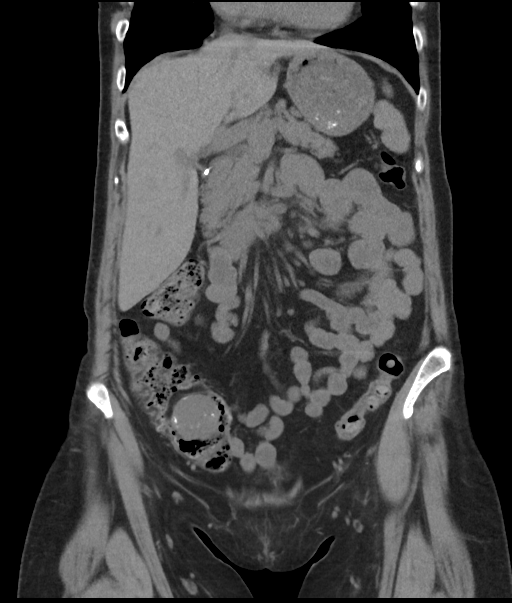
[im 34/76  soft-tissue]
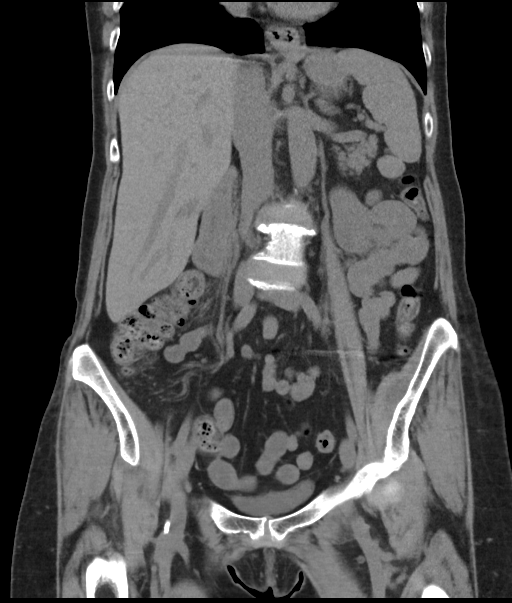
[im 42/76  soft-tissue]
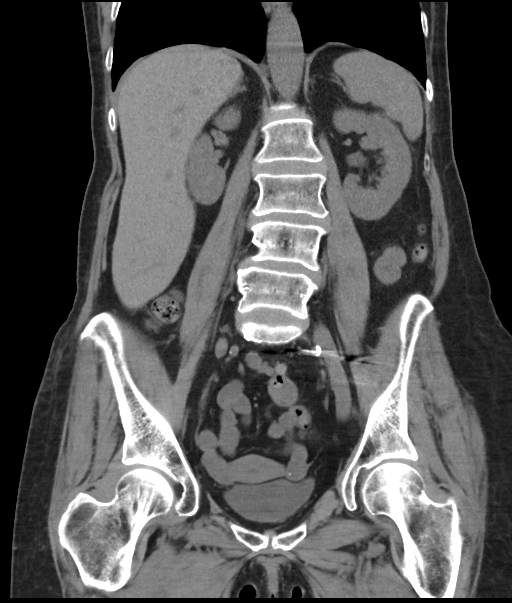

[16 of 46 positions shown; findings below may reference images not displayed]

FINDINGS: The visualized lung bases are clear.

The liver and spleen are unremarkable in appearance. The patient is
status post cholecystectomy, with clips noted at the gallbladder
fossa. The pancreas and adrenal glands are unremarkable.

The kidneys are unremarkable in appearance. There is no evidence of
hydronephrosis. No renal or ureteral stones are seen. No perinephric
stranding is appreciated.

No free fluid is identified. The small bowel is unremarkable in
appearance. The stomach is within normal limits. No acute vascular
abnormalities are seen. Mild scattered calcification is seen along
the abdominal aorta.

The appendix is not definitely characterized; there is no evidence
for appendicitis. Minimal diverticulosis is noted along the sigmoid
colon. The colon is otherwise unremarkable in appearance.

The bladder is mildly distended and grossly unremarkable. The uterus
is unremarkable in appearance. The ovaries are grossly symmetric. No
suspicious adnexal masses are seen. No inguinal lymphadenopathy is
seen. Postoperative change is noted at the left hemipelvis.

No acute osseous abnormalities are identified.
IMPRESSION: 1. No acute abnormality seen within the abdomen or pelvis.
2. Minimal diverticulosis along the sigmoid colon, without evidence
of diverticulitis.

## 2018-03-03 ENCOUNTER — Other Ambulatory Visit: Payer: Self-pay | Admitting: Nurse Practitioner

## 2018-03-03 DIAGNOSIS — Z1231 Encounter for screening mammogram for malignant neoplasm of breast: Secondary | ICD-10-CM

## 2019-06-30 ENCOUNTER — Other Ambulatory Visit: Payer: Self-pay | Admitting: Family Medicine

## 2019-06-30 DIAGNOSIS — Z13820 Encounter for screening for osteoporosis: Secondary | ICD-10-CM

## 2019-06-30 DIAGNOSIS — Z1231 Encounter for screening mammogram for malignant neoplasm of breast: Secondary | ICD-10-CM

## 2020-04-22 ENCOUNTER — Other Ambulatory Visit: Payer: Self-pay

## 2020-04-22 ENCOUNTER — Encounter: Payer: Self-pay | Admitting: Emergency Medicine

## 2020-04-22 ENCOUNTER — Ambulatory Visit
Admission: EM | Admit: 2020-04-22 | Discharge: 2020-04-22 | Disposition: A | Discharge status: Home / Self Care | Payer: Medicare HMO | Attending: Family Medicine | Admitting: Family Medicine

## 2020-04-22 DIAGNOSIS — J440 Chronic obstructive pulmonary disease with acute lower respiratory infection: Secondary | ICD-10-CM

## 2020-04-22 MED ORDER — DOXYCYCLINE HYCLATE 100 MG PO TABS: 100.0000 mg | ORAL_TABLET | Freq: Two times a day (BID) | ORAL | 0 refills | Status: DC

## 2020-04-22 MED ORDER — PREDNISONE 10 MG PO TABS: ORAL_TABLET | ORAL | 0 refills | Status: DC

## 2020-04-22 NOTE — ED Triage Notes (Signed)
Patient c/o cough, runny nose, sinus congestion, post nasal drip, scratchy throat, and bilateral itchy ears that started 2 days ago. Patient states that she has been taking Tylenol for her fevers.

## 2020-04-22 NOTE — ED Provider Notes (Signed)
[facility]    CSN: [id number] Arrival date & time: 04/22/20  1607      History   Chief Complaint Chief Complaint  Patient presents with  . Sinus Problem  . Nasal Congestion    HPI Adriana Howard is a 64 y.o. female.   64 yo female with a c/o productive cough, fevers, nasal congestion, scratchy throat for several days. Denies any chest pains or shortness of breath. Has been taking tylenol. Patient is a former smoker for many years but states she quit many years ago.    Sinus Problem    Past Medical History:  Diagnosis Date  . 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 GERD (gastroesophageal reflux disease)   . IBS (irritable bowel syndrome)   . Overactive bladder     There are no problems to display for this patient.   Past Surgical History:  Procedure Laterality Date  . CHOLECYSTECTOMY N/A 03/30/2016   Procedure: LAPAROSCOPIC CHOLECYSTECTOMY WITH INTRAOPERATIVE CHOLANGIOGRAM;  Surgeon: Nadeen Landau, MD;  Location: [facility] ORS;  Service: General;  Laterality: N/A;  . SPINE SURGERY     to remove a benign tumor    OB History   No obstetric history on file.      Home Medications    Prior to Admission medications   Medication Sig Start Date End Date Taking? Authorizing Provider  acetaminophen (TYLENOL) 500 MG tablet Take 1,000 mg by mouth every 6 (six) hours as needed for mild pain.   Yes [provider]  cholestyramine (QUESTRAN) 4 g packet Take 4 g by mouth daily. 04/18/16  Yes [provider]  famotidine (PEPCID) 20 MG tablet Take 20 mg by mouth 2 (two) times daily.   Yes [provider]  oxybutynin (DITROPAN-XL) 5 MG 24 hr tablet Take 5 mg by mouth daily. 01/11/20  Yes [provider]  doxycycline (VIBRA-TABS) 100 MG tablet Take 1 tablet (100 mg total) by mouth 2 (two) times daily. 04/22/20   Payton Mccallum, MD  predniSONE (DELTASONE) 10 MG tablet Start 60 mg po day one, then  50 mg po day two, taper by 10 mg daily until complete. 04/22/20   Payton Mccallum, MD  sucralfate (CARAFATE) 1 g tablet Take 1 g by mouth 3 (three) times daily.    [provider]    Family History Family History  Problem Relation Age of Onset  . Breast cancer Mother 21    Social History Social History   Tobacco Use  . Smoking status: Former Smoker    Packs/day: 1.50    Years: 20.00    Pack years: 30.00    Types: Cigarettes    Quit date: 03/21/1986    Years since quitting: 34.1  . Smokeless tobacco: Never Used  Vaping Use  . Vaping Use: Never used  Substance Use Topics  . Alcohol use: No  . Drug use: No     Allergies   Penicillin g   Review of Systems Review of Systems   Physical Exam Triage Vital Signs ED Triage Vitals  Enc Vitals Group     BP 04/22/20 1713 (!) 126/92     Pulse Rate 04/22/20 1713 99     Resp 04/22/20 1713 14     Temp 04/22/20 1713 98.5 F (36.9 C)     Temp Source 04/22/20 1713 Oral     SpO2 04/22/20 1713 98 %     Weight 04/22/20 1710 115  lb (52.2 kg)     Height 04/22/20 1710 5\' 7"  (1.702 m)     Head Circumference --      Peak Flow --      Pain Score 04/22/20 1710 5     Pain Loc --      Pain Edu? --      Excl. in GC? --    No data found.  Updated Vital Signs BP (!) 126/92 (BP Location: Right Arm)   Pulse 99   Temp 98.5 F (36.9 C) (Oral)   Resp 14   Ht 5\' 7"  (1.702 m)   Wt 52.2 kg   SpO2 98%   BMI 18.01 kg/m   Visual Acuity Right Eye Distance:   Left Eye Distance:   Bilateral Distance:    Right Eye Near:   Left Eye Near:    Bilateral Near:     Physical Exam Vitals and nursing note reviewed.  Constitutional:      General: She is not in acute distress.    Appearance: She is not toxic-appearing or diaphoretic.  HENT:     Right Ear: Tympanic membrane normal.     Left Ear: Tympanic membrane normal.     Nose: Congestion present.     Mouth/Throat:     Pharynx: Oropharynx is clear.  Cardiovascular:     Rate  and Rhythm: Normal rate and regular rhythm.     Heart sounds: Normal heart sounds.  Pulmonary:     Effort: Pulmonary effort is normal. No respiratory distress.     Breath sounds: No stridor. Rhonchi (diffuse bilateral) present. No wheezing or rales.  Musculoskeletal:     Cervical back: Neck supple.  Neurological:     Mental Status: She is alert.      UC Treatments / Results  Labs (all labs ordered are listed, but only abnormal results are displayed) Labs Reviewed - No data to display  EKG   Radiology No results found.  Procedures Procedures (including critical care time)  Medications Ordered in UC Medications - No data to display  Initial Impression / Assessment and Plan / UC Course  I have reviewed the triage vital signs and the nursing notes.  Pertinent labs & imaging results that were available during my care of the patient were reviewed by me and considered in my medical decision making (see chart for details).     Final Clinical Impressions(s) / UC Diagnoses   Final diagnoses:  Bronchitis, chronic obstructive w acute bronchitis [facility])    ED Prescriptions    Medication Sig Dispense Auth. Provider   doxycycline (VIBRA-TABS) 100 MG tablet Take 1 tablet (100 mg total) by mouth 2 (two) times daily. 20 tablet , MD   predniSONE (DELTASONE) 10 MG tablet Start 60 mg po day one, then 50 mg po day two, taper by 10 mg daily until complete. 21 tablet [facility], MD      1. diagnosis reviewed with patient 2. rx as per orders above; reviewed possible side effects, interactions, risks and benefits  3. Recommend supportive treatment with otc cough medication  4. Follow-up prn if symptoms worsen or don't improve   PDMP not reviewed this encounter.   Payton Mccallum, MD 04/22/20 [phone]

## 2020-08-23 ENCOUNTER — Other Ambulatory Visit: Payer: Self-pay | Admitting: Family Medicine

## 2020-08-23 DIAGNOSIS — Z1231 Encounter for screening mammogram for malignant neoplasm of breast: Secondary | ICD-10-CM

## 2020-08-23 DIAGNOSIS — Z0000 Encounter for general adult medical examination without abnormal findings: Secondary | ICD-10-CM

## 2020-08-31 ENCOUNTER — Ambulatory Visit
Admission: EM | Admit: 2020-08-31 | Discharge: 2020-08-31 | Disposition: A | Discharge status: Home / Self Care | Payer: Medicare HMO | Attending: Emergency Medicine | Admitting: Emergency Medicine

## 2020-08-31 ENCOUNTER — Ambulatory Visit (INDEPENDENT_AMBULATORY_CARE_PROVIDER_SITE_OTHER): Payer: Medicare HMO

## 2020-08-31 ENCOUNTER — Other Ambulatory Visit: Payer: Self-pay

## 2020-08-31 ENCOUNTER — Encounter: Payer: Self-pay | Admitting: Emergency Medicine

## 2020-08-31 DIAGNOSIS — J0100 Acute maxillary sinusitis, unspecified: Secondary | ICD-10-CM | POA: Diagnosis not present

## 2020-08-31 DIAGNOSIS — Z20822 Contact with and (suspected) exposure to covid-19: Secondary | ICD-10-CM | POA: Insufficient documentation

## 2020-08-31 DIAGNOSIS — R059 Cough, unspecified: Secondary | ICD-10-CM

## 2020-08-31 DIAGNOSIS — J069 Acute upper respiratory infection, unspecified: Secondary | ICD-10-CM | POA: Diagnosis not present

## 2020-08-31 DIAGNOSIS — M26622 Arthralgia of left temporomandibular joint: Secondary | ICD-10-CM | POA: Diagnosis not present

## 2020-08-31 DIAGNOSIS — Z87891 Personal history of nicotine dependence: Secondary | ICD-10-CM | POA: Diagnosis not present

## 2020-08-31 DIAGNOSIS — R509 Fever, unspecified: Secondary | ICD-10-CM

## 2020-08-31 LAB — RESP PANEL BY RT-PCR (FLU A&B, COVID) ARPGX2
Influenza A by PCR: NEGATIVE
Influenza B by PCR: NEGATIVE
SARS Coronavirus 2 by RT PCR: NEGATIVE

## 2020-08-31 MED ORDER — CYCLOBENZAPRINE HCL 10 MG PO TABS: 10.0000 mg | ORAL_TABLET | Freq: Every day | ORAL | 0 refills | Status: DC

## 2020-08-31 MED ORDER — FLUTICASONE PROPIONATE 50 MCG/ACT NA SUSP: 2.0000 | Freq: Every day | NASAL | 0 refills | Status: DC

## 2020-08-31 MED ORDER — AEROCHAMBER PLUS MISC: 2 refills | Status: AC

## 2020-08-31 MED ORDER — ALBUTEROL SULFATE HFA 108 (90 BASE) MCG/ACT IN AERS: 1.0000 | INHALATION_SPRAY | RESPIRATORY_TRACT | 0 refills | Status: AC | PRN

## 2020-08-31 MED ORDER — DOXYCYCLINE HYCLATE 100 MG PO CAPS: 100.0000 mg | ORAL_CAPSULE | Freq: Two times a day (BID) | ORAL | 0 refills | Status: AC

## 2020-08-31 NOTE — ED Provider Notes (Signed)
HPI  SUBJECTIVE:  Adriana Howard is a 64 y.o. female who presents with 4 days of nasal congestion, green rhinorrhea, sinus pain and pressure, upper dental pain, postnasal drip, cough, fevers T-max 101 last night and chest soreness secondary to the cough. She reports left ear pain starting last night with change in hearing.  No otorrhea, foreign body insertion.  No facial swelling, body aches or headaches, sore throat, loss of sense of smell or taste, wheezing, shortness of breath, nausea, vomiting, diarrhea, abdominal pain.  No known Covid or flu exposure.  She has gotten both the Covid vaccine, last dose was the [place] in May, and the flu vaccine.  She has also had the Pneumovax.    She took an over-the-counter cold medication within the past 6 hours.  No antibiotics in the past month.  She is not sure if she grinds her teeth at night.  She has tried heat on the ear with improvement in her symptoms.  Her left ear is worse with chewing and yawning.  She is a former smoker, denies history of pulmonary disease.  No history of hypertension, chronic kidney disease, coronary artery disease, TMJ.  She had frequent ear infections in childhood.  PMD: [facility].    Past Medical History:  Diagnosis Date  . 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 GERD (gastroesophageal reflux disease)   . IBS (irritable bowel syndrome)   . Overactive bladder     Past Surgical History:  Procedure Laterality Date  . CHOLECYSTECTOMY N/A 03/30/2016   Procedure: LAPAROSCOPIC CHOLECYSTECTOMY WITH INTRAOPERATIVE CHOLANGIOGRAM;  Surgeon: Nadeen Landau, MD;  Location: [facility] ORS;  Service: General;  Laterality: N/A;  . SPINE SURGERY     to remove a benign tumor    Family History  Problem Relation Age of Onset  . Breast cancer Mother 28    Social History   Tobacco Use  . Smoking status: Former Smoker    Packs/day: 1.50    Years: 20.00    Pack years: 30.00    Types: Cigarettes     Quit date: 03/21/1986    Years since quitting: 34.4  . Smokeless tobacco: Never Used  Vaping Use  . Vaping Use: Never used  Substance Use Topics  . Alcohol use: No  . Drug use: No    No current facility-administered medications for this encounter.  Current Outpatient Medications:  .  acetaminophen (TYLENOL) 500 MG tablet, Take 1,000 mg by mouth every 6 (six) hours as needed for mild pain., Disp: , Rfl:  .  cholestyramine (QUESTRAN) 4 g packet, Take 4 g by mouth daily., Disp: , Rfl: 0 .  oxybutynin (DITROPAN-XL) 5 MG 24 hr tablet, Take 5 mg by mouth daily., Disp: , Rfl:  .  sucralfate (CARAFATE) 1 g tablet, Take 1 g by mouth 3 (three) times daily., Disp: , Rfl:  .  albuterol (VENTOLIN HFA) 108 (90 Base) MCG/ACT inhaler, Inhale 1-2 puffs into the lungs every 4 (four) hours as needed for wheezing or shortness of breath., Disp: 1 each, Rfl: 0 .  cyclobenzaprine (FLEXERIL) 10 MG tablet, Take 1 tablet (10 mg total) by mouth at bedtime., Disp: 20 tablet, Rfl: 0 .  doxycycline (VIBRAMYCIN) 100 MG capsule, Take 1 capsule (100 mg total) by mouth 2 (two) times daily for 10 days., Disp: 20 capsule, Rfl: 0 .  famotidine (PEPCID) 20 MG tablet, Take 20 mg by mouth 2 (two) times daily., Disp: ,  Rfl:  .  fluticasone (FLONASE) 50 MCG/ACT nasal spray, Place 2 sprays into both nostrils daily., Disp: 16 g, Rfl: 0 .  Spacer/Aero-Holding Chambers (AEROCHAMBER PLUS) inhaler, Use with inhaler, Disp: 1 each, Rfl: 2  Allergies  Allergen Reactions  . Penicillin G Rash    Has patient had a PCN reaction causing immediate rash, facial/tongue/throat swelling, SOB or lightheadedness with hypotension: Yes Has patient had a PCN reaction causing severe rash involving mucus membranes or skin necrosis: No Has patient had a PCN reaction that required hospitalization No Has patient had a PCN reaction occurring within the last 10 years: No If all of the above answers are "NO", then may proceed with Cephalosporin use.       ROS  As noted in HPI.   Physical Exam  BP 134/90 (BP Location: Left Arm)   Pulse 78   Temp 97.7 F (36.5 C) (Oral)   Resp 18   Ht 5\' 7"  (1.702 m)   Wt 52.2 kg   SpO2 94%   BMI 18.02 kg/m   Constitutional: Well developed, well nourished, no acute distress Eyes:  EOMI, conjunctiva normal bilaterally HENT: Normocephalic, atraumatic,mucus membranes moist.  Positive nasal congestion.  Erythematous, but not swollen turbinates.  Positive maxillary sinus tenderness.  No frontal sinus tenderness.  No pain with traction on pinna left ear.  No pain with palpation of mastoid.  Pain with palpation of tragus.  Positive TMJ tenderness left side.  No crepitus.  Left EAC normal.  Left TM erythematous, but not dull or bulging.  No appreciable effusion.  Right TM normal. Neck: Positive cervical lymphadenopathy Respiratory: Normal inspiratory effort, lungs clear bilaterally, good air movement. Cardiovascular: Normal rate, regular rhythm no murmurs rubs or gallops. GI: nondistended skin: No rash, skin intact Musculoskeletal: no deformities Neurologic: Alert & oriented x 3, no focal neuro deficits Psychiatric: Speech and behavior appropriate   ED Course   Medications - No data to display  Orders Placed This Encounter  Procedures  . Resp Panel by RT-PCR (Flu A&B, Covid) Nasopharyngeal Swab    Standing Status:   Standing    Number of Occurrences:   1    Order Specific Question:   Is this test for diagnosis or screening    Answer:   Diagnosis of ill patient    Order Specific Question:   Symptomatic for COVID-19 as defined by CDC    Answer:   Yes    Order Specific Question:   Date of Symptom Onset    Answer:   08/28/2020    Order Specific Question:   Hospitalized for COVID-19    Answer:   No    Order Specific Question:   Admitted to ICU for COVID-19    Answer:   No    Order Specific Question:   Previously tested for COVID-19    Answer:   No    Order Specific Question:   Resident in  a congregate (group) care setting    Answer:   No    Order Specific Question:   Employed in healthcare setting    Answer:   No    Order Specific Question:   Pregnant    Answer:   No    Order Specific Question:   Has patient completed COVID vaccination(s) (2 doses of Pfizer/Moderna 1 dose of 08/30/2020)    Answer:   Yes  . DG Chest 2 View    Standing Status:   Standing    Number of Occurrences:  1    Order Specific Question:   Reason for Exam (SYMPTOM  OR DIAGNOSIS REQUIRED)    Answer:   cough fever r/o PNA    No results found for this or any previous visit (from the past 24 hour(s)). DG Chest 2 View  Result Date: 08/31/2020 CLINICAL DATA:  Cough and fever EXAM: CHEST - 2 VIEW COMPARISON:  May 06, 2016 FINDINGS: Lungs mildly hyperexpanded. No edema or airspace opacity. Heart size and pulmonary vascularity are normal. No adenopathy. There is thoracolumbar levoscoliosis. IMPRESSION: Lungs mildly hyperexpanded without edema or airspace opacity. Cardiac silhouette normal. No adenopathy. Electronically Signed   By: Bretta Bang III M.D.   On: 08/31/2020 09:37    ED Clinical Impression  1. Upper respiratory tract infection, unspecified type   2. Acute non-recurrent maxillary sinusitis   3. Arthralgia of left temporomandibular joint      ED Assessment/Plan  Patient with a URI.  What is concerning is new onset of fevers and an oxygen saturation of 94, her baseline seems to be around 98%.  Will check chest x-ray.  Flu, Covid sent.  She has maxillary sinus tenderness.  States that she was getting better, but then the maxillary sinus pain and pressure started getting worse and she had new onset fevers.  Reviewed imaging independently.  Mild hyperexpansion without edema or airspace opacity see radiology report for full details.  Will send home on doxycycline for sinusitis because of the double sickening.  She has history of urticaria with penicillins.  Flonase, saline nasal  irrigation, Mucinex.  Albuterol inhaler with a spacer because she has a history of COPD per chart review although she denies having COPD.  She is a former smoker.   Left otalgia: Does not appear to be a classic otitis media.  Suspect TMJ arthralgia.  Tylenol/ibuprofen, soft diet and Flexeril at night only.   Flu, Covid negative.  Discussed labs, imaging, MDM, treatment plan, and plan for follow-up with patient. Discussed sn/sx that should prompt return to the ED. patient agrees with plan.   Meds ordered this encounter  Medications  . albuterol (VENTOLIN HFA) 108 (90 Base) MCG/ACT inhaler    Sig: Inhale 1-2 puffs into the lungs every 4 (four) hours as needed for wheezing or shortness of breath.    Dispense:  1 each    Refill:  0  . Spacer/Aero-Holding Chambers (AEROCHAMBER PLUS) inhaler    Sig: Use with inhaler    Dispense:  1 each    Refill:  2    Please educate patient on use  . fluticasone (FLONASE) 50 MCG/ACT nasal spray    Sig: Place 2 sprays into both nostrils daily.    Dispense:  16 g    Refill:  0  . cyclobenzaprine (FLEXERIL) 10 MG tablet    Sig: Take 1 tablet (10 mg total) by mouth at bedtime.    Dispense:  20 tablet    Refill:  0  . doxycycline (VIBRAMYCIN) 100 MG capsule    Sig: Take 1 capsule (100 mg total) by mouth 2 (two) times daily for 10 days.    Dispense:  20 capsule    Refill:  0    *This clinic note was created using Scientist, clinical (histocompatibility and immunogenetics). Therefore, there may be occasional mistakes despite careful proofreading.   ?    Domenick Gong, MD 09/01/20 1037

## 2020-08-31 NOTE — ED Triage Notes (Signed)
Pt c/o nasal congestion, fever(101 last night), left ear pain, cough. Started about 3 days ago. She has had covid vaccines.

## 2020-08-31 NOTE — Discharge Instructions (Addendum)
Your Covid, flu were negative.  Your chest x-ray is negative for pneumonia.  You have an upper respiratory infection, but because you got better and then got worse with a new fever, and sinus tenderness, I will treat you for a sinus infection with doxycycline for 10 days.  Try some Mucinex, Flonase, saline nasal irrigation with a Lloyd Huger med rinse and distilled water as often as you want.  He can try 2 puffs from your albuterol inhaler using your spacer every 4-6 hours as needed for coughing.  It will help open up your lungs.  Your ear is not infected.  Suspect that it is your temporomandibular joint.  Take 400 mg of ibuprofen combined with 500 mg of Tylenol 3-4 times a day as needed.  Soft diet for the next several days, Flexeril at night to prevent you from grinding your teeth

## 2020-09-25 ENCOUNTER — Encounter: Payer: Self-pay | Admitting: Emergency Medicine

## 2020-09-25 ENCOUNTER — Ambulatory Visit
Admission: EM | Admit: 2020-09-25 | Discharge: 2020-09-25 | Disposition: A | Discharge status: Home / Self Care | Payer: Medicare HMO | Attending: Sports Medicine | Admitting: Sports Medicine

## 2020-09-25 ENCOUNTER — Other Ambulatory Visit: Payer: Self-pay

## 2020-09-25 DIAGNOSIS — M940 Chondrocostal junction syndrome [Tietze]: Secondary | ICD-10-CM

## 2020-09-25 DIAGNOSIS — R0789 Other chest pain: Secondary | ICD-10-CM

## 2020-09-25 DIAGNOSIS — M25612 Stiffness of left shoulder, not elsewhere classified: Secondary | ICD-10-CM

## 2020-09-25 LAB — CBC WITH DIFFERENTIAL/PLATELET
Abs Immature Granulocytes: 0.01 10*3/uL (ref 0.00–0.07)
Basophils Absolute: 0.1 10*3/uL (ref 0.0–0.1)
Basophils Relative: 2 %
Eosinophils Absolute: 0.3 10*3/uL (ref 0.0–0.5)
Eosinophils Relative: 4 %
HCT: 46.7 % — ABNORMAL HIGH (ref 36.0–46.0)
Hemoglobin: 15.3 g/dL — ABNORMAL HIGH (ref 12.0–15.0)
Immature Granulocytes: 0 %
Lymphocytes Relative: 19 %
Lymphs Abs: 1.5 10*3/uL (ref 0.7–4.0)
MCH: 30.7 pg (ref 26.0–34.0)
MCHC: 32.8 g/dL (ref 30.0–36.0)
MCV: 93.6 fL (ref 80.0–100.0)
Monocytes Absolute: 0.6 10*3/uL (ref 0.1–1.0)
Monocytes Relative: 7 %
Neutro Abs: 5.3 10*3/uL (ref 1.7–7.7)
Neutrophils Relative %: 68 %
Platelets: 289 10*3/uL (ref 150–400)
RBC: 4.99 MIL/uL (ref 3.87–5.11)
RDW: 13.7 % (ref 11.5–15.5)
WBC: 7.7 10*3/uL (ref 4.0–10.5)
nRBC: 0 % (ref 0.0–0.2)

## 2020-09-25 LAB — TROPONIN I (HIGH SENSITIVITY): Troponin I (High Sensitivity): 2 ng/L (ref <18)

## 2020-09-25 MED ORDER — PREDNISONE 10 MG (21) PO TBPK: ORAL_TABLET | Freq: Every day | ORAL | 0 refills | Status: DC

## 2020-09-25 NOTE — ED Triage Notes (Signed)
Patient c/o left side chest muscle pain that 2 days ago.  Patient reports SOB.  Patient reports pain when she takes a deep breath.  Patient states she was trying to get out of bed when she thinks that she pulled a muscle.

## 2020-09-25 NOTE — ED Provider Notes (Addendum)
[facility]    CSN: [id number] Arrival date & time: 09/25/20  0944      History   Chief Complaint Chief Complaint  Patient presents with  . Chest Pain    HPI Adriana Howard is a 64 y.o. female.   Patient pleasant 64 year old female who presents for evaluation of the above issue.  She comes in today complaining of left-sided chest pain.  There is no central or substernal chest pain.  She reports some shortness of breath if she takes a deep breath.  She thinks is related to some trauma that occurred when she rolled over in bed a few days ago.  She was try to push herself up and felt some pain.  The pain is localized just superior to her breast and a little bit lateral almost to the mid axillary line over several of the ribs.  No fever shakes chills.  No exposure to Covid.  Vital signs are stable.  Repeat heart rate was 88 bpm.     Past Medical History:  Diagnosis Date  . 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 GERD (gastroesophageal reflux disease)   . IBS (irritable bowel syndrome)   . Overactive bladder     There are no problems to display for this patient.   Past Surgical History:  Procedure Laterality Date  . CHOLECYSTECTOMY N/A 03/30/2016   Procedure: LAPAROSCOPIC CHOLECYSTECTOMY WITH INTRAOPERATIVE CHOLANGIOGRAM;  Surgeon: Nadeen LandauJarvis Wilton Smith, MD;  Location: [facility] ORS;  Service: General;  Laterality: N/A;  . SPINE SURGERY     to remove a benign tumor    OB History   No obstetric history on file.      Home Medications    Prior to Admission medications   Medication Sig Start Date End Date Taking? Authorizing Provider  albuterol (VENTOLIN HFA) 108 (90 Base) MCG/ACT inhaler Inhale 1-2 puffs into the lungs every 4 (four) hours as needed for wheezing or shortness of breath. 08/31/20  Yes Domenick GongMortenson, Ashley, MD  cholestyramine Lanetta Inch(QUESTRAN) 4 g packet Take 4 g by mouth daily. 04/18/16  Yes [provider]   cyclobenzaprine (FLEXERIL) 10 MG tablet Take 1 tablet (10 mg total) by mouth at bedtime. 08/31/20  Yes Domenick GongMortenson, Ashley, MD  fluticasone (FLONASE) 50 MCG/ACT nasal spray Place 2 sprays into both nostrils daily. 08/31/20  Yes Domenick GongMortenson, Ashley, MD  oxybutynin (DITROPAN-XL) 5 MG 24 hr tablet Take 5 mg by mouth daily. 01/11/20  Yes [provider]  acetaminophen (TYLENOL) 500 MG tablet Take 1,000 mg by mouth every 6 (six) hours as needed for mild pain.    [provider]  famotidine (PEPCID) 20 MG tablet Take 20 mg by mouth 2 (two) times daily.    [provider]  predniSONE (STERAPRED UNI-PAK 21 TAB) 10 MG (21) TBPK tablet Take by mouth daily. Take 6 tabs by mouth daily  for 2 days, then 5 tabs for 2 days, then 4 tabs for 2 days, then 3 tabs for 2 days, 2 tabs for 2 days, then 1 tab by mouth daily for 2 days 09/25/20   Delton SeeBarnes, Teruko Joswick, MD  Spacer/Aero-Holding Chambers (AEROCHAMBER PLUS) inhaler Use with inhaler 08/31/20   Domenick GongMortenson, Ashley, MD  sucralfate (CARAFATE) 1 g tablet Take 1 g by mouth 3 (three) times daily.    [provider]    Family History Family History  Problem Relation Age of Onset  . Breast cancer Mother 3360  Social History Social History   Tobacco Use  . Smoking status: Former Smoker    Packs/day: 1.50    Years: 20.00    Pack years: 30.00    Types: Cigarettes    Quit date: 03/21/1986    Years since quitting: 34.5  . Smokeless tobacco: Never Used  Vaping Use  . Vaping Use: Never used  Substance Use Topics  . Alcohol use: No  . Drug use: No     Allergies   Penicillin g   Review of Systems Review of Systems  Constitutional: Positive for activity change. Negative for appetite change, chills, fatigue and fever.  HENT: Negative.   Eyes: Negative.   Respiratory: Positive for shortness of breath. Negative for apnea, cough, choking, chest tightness, wheezing and stridor.        Patient reports some shortness of breath with deep  inspiration with pain localized to the left side of her chest wall just above the breast into the axilla.  Cardiovascular: Positive for chest pain.  Gastrointestinal: Negative.   Endocrine: Negative.   Genitourinary: Negative.   Musculoskeletal: Negative.   Skin: Negative.   Neurological: Negative.      Physical Exam Triage Vital Signs ED Triage Vitals  Enc Vitals Group     BP 09/25/20 1146 112/84     Pulse Rate 09/25/20 1146 (!) 108     Resp 09/25/20 1146 14     Temp 09/25/20 1146 98 F (36.7 C)     Temp Source 09/25/20 1146 Oral     SpO2 09/25/20 1146 94 %     Weight 09/25/20 1143 115 lb (52.2 kg)     Height 09/25/20 1143 5\' 7"  (1.702 m)     Head Circumference --      Peak Flow --      Pain Score 09/25/20 1143 5     Pain Loc --      Pain Edu? --      Excl. in GC? --    No data found.  Updated Vital Signs BP 112/84 (BP Location: Left Arm)   Pulse (!) 108   Temp 98 F (36.7 C) (Oral)   Resp 14   Ht 5\' 7"  (1.702 m)   Wt 52.2 kg   SpO2 94%   BMI 18.01 kg/m   Visual Acuity Right Eye Distance:   Left Eye Distance:   Bilateral Distance:    Right Eye Near:   Left Eye Near:    Bilateral Near:     Physical Exam Vitals and nursing note reviewed.  Constitutional:      General: She is not in acute distress.    Appearance: She is well-developed. She is not ill-appearing, toxic-appearing or diaphoretic.  HENT:     Head: Normocephalic and atraumatic.  Eyes:     Extraocular Movements: Extraocular movements intact.     Pupils: Pupils are equal, round, and reactive to light.  Neck:     Vascular: No JVD.  Cardiovascular:     Rate and Rhythm: Normal rate and regular rhythm.  No extrasystoles are present.    Chest Wall: PMI is not displaced.     Pulses:          Carotid pulses are 2+ on the right side and 2+ on the left side.      Radial pulses are 2+ on the right side and 2+ on the left side.     Heart sounds: Normal heart sounds. Heart sounds not distant. No  murmur heard.  No friction rub. No gallop. No S3 or S4 sounds.   Pulmonary:     Effort: Pulmonary effort is normal. No tachypnea, accessory muscle usage or respiratory distress.     Breath sounds: Normal breath sounds. No stridor.  Chest:     Comments: Patient is point tender over the ribs just superior to the left breast extending into the axilla.  No crepitus noted.  No masses.  Patient has decreased range of motion of the left shoulder with forward flexion as well as abduction.  There is a little decreased range of motion with external rotation.  Internal rotation actually appears to be equal to the contralateral side.  The decreased range of motion is due to pain.  When I passively put her through range of motion I am able to get past 90 degrees both flexion and abduction but it does cause her discomfort.  I do not appreciate any focal rotator cuff weakness.  That said, is a very limited exam due to her pain. Musculoskeletal:     Cervical back: Normal range of motion and neck supple.  Lymphadenopathy:     Cervical: No cervical adenopathy.  Neurological:     Mental Status: She is alert.      UC Treatments / Results  Labs (all labs ordered are listed, but only abnormal results are displayed) Labs Reviewed  CBC WITH DIFFERENTIAL/PLATELET - Abnormal; Notable for the following components:      Result Value   Hemoglobin 15.3 (*)    HCT 46.7 (*)    All other components within normal limits  TROPONIN I (HIGH SENSITIVITY)    EKG  EKG done on September 25, 2020 at 11:50 AM showed a ventricular rate of 88 bpm.  Normal sinus rhythm.  PR interval of 172 ms, QRS duration 64 ms.  QT and corrected QT of 362 and 438 ms.  No acute ST elevation or significant concerns for acute coronary syndrome.  Radiology No results found.  Procedures Procedures (including critical care time)  Medications Ordered in UC Medications - No data to display  Initial Impression / Assessment and Plan / UC Course   I have reviewed the triage vital signs and the nursing notes.  Pertinent labs & imaging results that were available during my care of the patient were reviewed by me and considered in my medical decision making (see chart for details).  Clinical impression: Left-sided chest pain consistent with a strain versus costochondritis.  She also has developed some decreased range of motion with a limited exam of her left shoulder.  While I doubt rotator cuff pathology it certainly possible given that she rolled over in bed and felt pain when doing so.  Treatment plan: 1.  The findings and treatment plan were discussed in detail with the patient.  The patient was in agreement. 2.  Recommended getting an EKG which showed normal sinus rhythm with a ventricular rate of 88 bpm.  PR interval, QRS duration, and QT were all normal.  No ST elevation or acute coronary syndrome findings.  Also did a troponin which was within normal limits with a value of 2.  Doubt that she is having ACS. 3.  She does not take NSAIDs because of hypertension so I will give her a Sterapred Dosepak. 4.  I will refer her to orthopedics to ensure that she is improving.  They may be better able to assess her rotator cuff given the limitations on her exam today.  She may benefit from a  steroid injection plus or minus physical therapy I will leave it to their discretion. 5.  Give her some educational handouts. 6.  Supportive care over-the-counter meds.  No NSAIDs or while she is taking the [employer]. 7.  Discharge her from care at this time and she is welcome to see [place] in the future as needed.     Final Clinical Impressions(s) / UC Diagnoses   Final diagnoses:  Chest wall pain  Stiffness of left shoulder joint  Costochondritis     Discharge Instructions     Recommended getting an EKG which showed normal sinus rhythm with a ventricular rate of 88 bpm.  PR interval, QRS duration, and QT were all normal.  No ST elevation or  acute coronary syndrome findings.  Also did a troponin which was within normal limits with a value of 2.  Doubt that she is having ACS. She does not take NSAIDs because of hypertension so I will give her a Sterapred Dosepak. I will refer her to orthopedics to ensure that she is improving.  They may be better able to assess her rotator cuff given the limitations on her exam today.  She may benefit from a steroid injection plus or minus physical therapy I will leave it to their discretion. Give her some educational handouts. Supportive care over-the-counter meds.  No NSAIDs or while she is taking the [employer]. Discharge her from care at this time and she is welcome to see [place] in the future as needed.    ED Prescriptions    Medication Sig Dispense Auth. Provider   predniSONE (STERAPRED UNI-PAK 21 TAB) 10 MG (21) TBPK tablet Take by mouth daily. Take 6 tabs by mouth daily  for 2 days, then 5 tabs for 2 days, then 4 tabs for 2 days, then 3 tabs for 2 days, 2 tabs for 2 days, then 1 tab by mouth daily for 2 days 42 tablet Delton See, MD     PDMP not reviewed this encounter.   Delton See, MD 09/25/20 1343    Delton See, MD 09/25/20 [id number]

## 2020-09-25 NOTE — Discharge Instructions (Addendum)
Recommended getting an EKG which showed normal sinus rhythm with a ventricular rate of 88 bpm.  PR interval, QRS duration, and QT were all normal.  No ST elevation or acute coronary syndrome findings.  Also did a troponin which was within normal limits with a value of 2.  Doubt that she is having ACS. She does not take NSAIDs because of hypertension so I will give her a Sterapred Dosepak. I will refer her to orthopedics to ensure that she is improving.  They may be better able to assess her rotator cuff given the limitations on her exam today.  She may benefit from a steroid injection plus or minus physical therapy I will leave it to their discretion. Give her some educational handouts. Supportive care over-the-counter meds.  No NSAIDs or while she is taking the [employer]. Discharge her from care at this time and she is welcome to see [place] in the future as needed.

## 2021-08-08 ENCOUNTER — Other Ambulatory Visit: Payer: Self-pay | Admitting: Family Medicine

## 2021-08-08 DIAGNOSIS — Z1231 Encounter for screening mammogram for malignant neoplasm of breast: Secondary | ICD-10-CM

## 2022-02-13 IMAGING — CR DG CHEST 2V
2 series · 2 of 2 positions shown · non-contrast
Comparison: May 06, 2016

CLINICAL DATA: Cough and fever

EXAM:
CHEST - 2 VIEW

[chest pa]
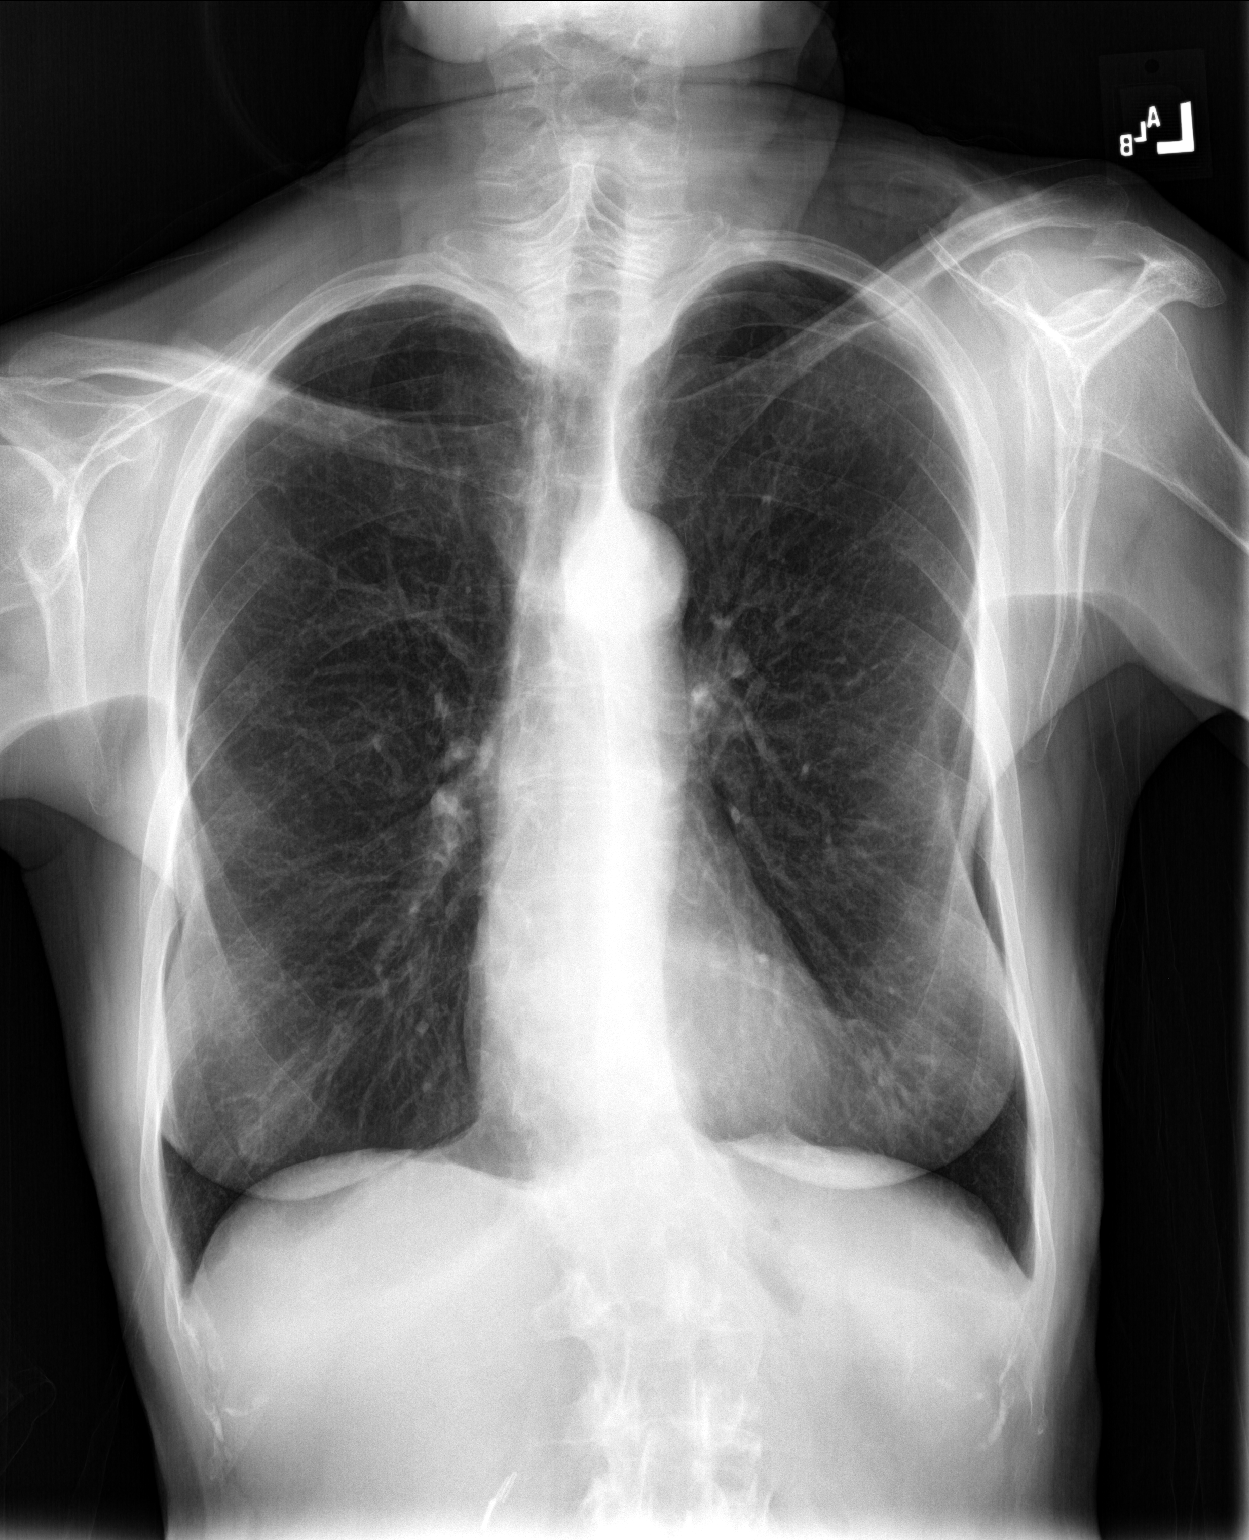

[chest lat]
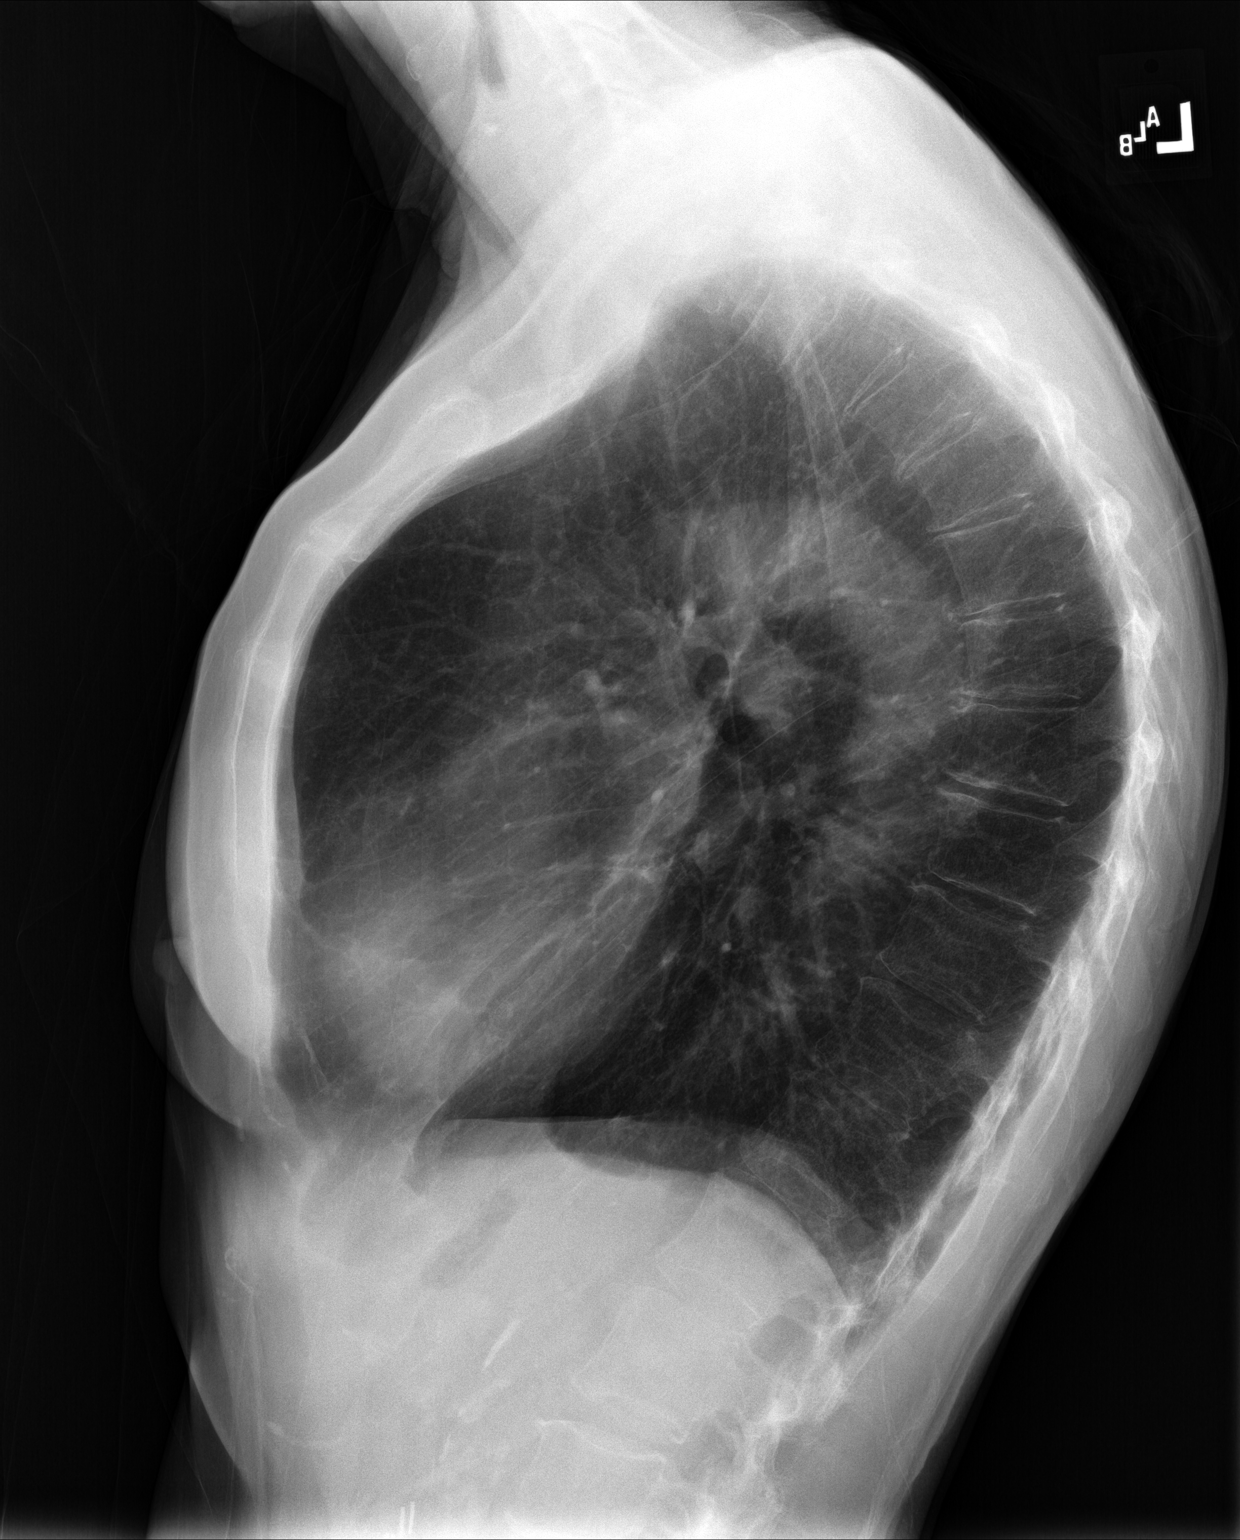

[2 of 2 positions shown; findings below may reference images not displayed]

FINDINGS: Lungs mildly hyperexpanded. No edema or airspace opacity. Heart size
and pulmonary vascularity are normal. No adenopathy. There is
thoracolumbar levoscoliosis.
IMPRESSION: Lungs mildly hyperexpanded without edema or airspace opacity.
Cardiac silhouette normal. No adenopathy.

## 2022-04-06 ENCOUNTER — Other Ambulatory Visit: Payer: Self-pay | Admitting: Family Medicine

## 2022-04-06 DIAGNOSIS — Z1231 Encounter for screening mammogram for malignant neoplasm of breast: Secondary | ICD-10-CM

## 2022-04-06 DIAGNOSIS — Z780 Asymptomatic menopausal state: Secondary | ICD-10-CM

## 2022-05-01 ENCOUNTER — Ambulatory Visit
Admission: RE | Admit: 2022-05-01 | Discharge: 2022-05-01 | Disposition: A | Discharge status: Home / Self Care | Payer: Medicare HMO | Source: Ambulatory Visit | Attending: Family Medicine | Admitting: Family Medicine

## 2022-05-01 DIAGNOSIS — Z780 Asymptomatic menopausal state: Secondary | ICD-10-CM | POA: Diagnosis present

## 2022-05-01 DIAGNOSIS — Z1231 Encounter for screening mammogram for malignant neoplasm of breast: Secondary | ICD-10-CM

## 2023-05-13 LAB — LAB REPORT - SCANNED: EGFR: 87

## 2023-05-16 ENCOUNTER — Other Ambulatory Visit: Payer: Self-pay | Admitting: Family Medicine

## 2023-05-16 DIAGNOSIS — Z1231 Encounter for screening mammogram for malignant neoplasm of breast: Secondary | ICD-10-CM

## 2023-07-31 ENCOUNTER — Other Ambulatory Visit: Payer: Self-pay | Admitting: Pulmonary Disease

## 2023-07-31 DIAGNOSIS — J411 Mucopurulent chronic bronchitis: Secondary | ICD-10-CM

## 2023-08-05 ENCOUNTER — Ambulatory Visit
Admission: RE | Admit: 2023-08-05 | Discharge: 2023-08-05 | Disposition: A | Discharge status: Home / Self Care | Payer: Medicare HMO | Source: Ambulatory Visit | Attending: Pulmonary Disease | Admitting: Pulmonary Disease

## 2023-08-05 DIAGNOSIS — J411 Mucopurulent chronic bronchitis: Secondary | ICD-10-CM | POA: Insufficient documentation

## 2023-08-05 MED ORDER — IOHEXOL 300 MG/ML  SOLN
75.0000 mL | Freq: Once | INTRAMUSCULAR | Status: AC | PRN
  Administered 2023-08-05: 75 mL via INTRAVENOUS

## 2023-09-10 ENCOUNTER — Other Ambulatory Visit: Payer: Self-pay | Admitting: Unknown Physician Specialty

## 2023-09-10 DIAGNOSIS — E041 Nontoxic single thyroid nodule: Secondary | ICD-10-CM

## 2023-09-12 ENCOUNTER — Ambulatory Visit
Admission: RE | Admit: 2023-09-12 | Discharge: 2023-09-12 | Disposition: A | Discharge status: Home / Self Care | Payer: Medicare HMO | Source: Ambulatory Visit | Attending: Unknown Physician Specialty | Admitting: Unknown Physician Specialty

## 2023-09-12 DIAGNOSIS — E041 Nontoxic single thyroid nodule: Secondary | ICD-10-CM

## 2023-09-13 ENCOUNTER — Other Ambulatory Visit: Payer: Self-pay | Admitting: Unknown Physician Specialty

## 2023-09-13 DIAGNOSIS — E041 Nontoxic single thyroid nodule: Secondary | ICD-10-CM

## 2023-09-13 NOTE — Progress Notes (Signed)
Patient for Adriana Howard guided RT Inferior & LT Mid thyroid nodule biopsy on Monday 09/16/2023, I called and spoke with the patient on the phone and gave pre-procedure instructions. Pt was made aware to be here at 2p and check in at the [facility] registration desk. Pt stated understanding.  Called 09/13/2023

## 2023-09-16 ENCOUNTER — Ambulatory Visit
Admission: RE | Admit: 2023-09-16 | Discharge: 2023-09-16 | Disposition: A | Discharge status: Home / Self Care | Payer: Medicare HMO | Source: Ambulatory Visit | Attending: Unknown Physician Specialty | Admitting: Unknown Physician Specialty

## 2023-09-16 DIAGNOSIS — E041 Nontoxic single thyroid nodule: Secondary | ICD-10-CM | POA: Diagnosis present

## 2023-09-16 MED ORDER — LIDOCAINE HCL (PF) 1 % IJ SOLN
10.0000 mL | Freq: Once | INTRAMUSCULAR | Status: AC
  Administered 2023-09-16: 10 mL via SUBCUTANEOUS
  Filled 2023-09-16: qty 10

## 2023-09-17 LAB — CYTOLOGY - NON PAP

## 2023-10-15 ENCOUNTER — Encounter: Payer: Self-pay | Admitting: Unknown Physician Specialty

## 2023-10-16 ENCOUNTER — Other Ambulatory Visit: Payer: Self-pay | Admitting: Unknown Physician Specialty

## 2023-11-05 ENCOUNTER — Inpatient Hospital Stay: Admission: RE | Admit: 2023-11-05 | Payer: Medicare HMO | Source: Ambulatory Visit

## 2023-11-08 ENCOUNTER — Encounter
Admission: RE | Admit: 2023-11-08 | Discharge: 2023-11-08 | Disposition: A | Discharge status: Home / Self Care | Payer: Medicare HMO | Source: Ambulatory Visit | Attending: Unknown Physician Specialty | Admitting: Unknown Physician Specialty

## 2023-11-08 VITALS — Ht 67.0 in | Wt 110.0 lb

## 2023-11-08 DIAGNOSIS — J449 Chronic obstructive pulmonary disease, unspecified: Secondary | ICD-10-CM

## 2023-11-08 DIAGNOSIS — Z01812 Encounter for preprocedural laboratory examination: Secondary | ICD-10-CM

## 2023-11-08 DIAGNOSIS — Z01810 Encounter for preprocedural cardiovascular examination: Secondary | ICD-10-CM

## 2023-11-08 HISTORY — DX: Chronic obstructive pulmonary disease, unspecified: J44.9

## 2023-11-08 HISTORY — DX: Other specified postprocedural states: R11.2

## 2023-11-08 HISTORY — DX: Nontoxic single thyroid nodule: E04.1

## 2023-11-08 HISTORY — DX: Personal history of nicotine dependence: Z87.891

## 2023-11-08 HISTORY — DX: Personal history of other diseases of the digestive system: Z87.19

## 2023-11-08 HISTORY — DX: Other fecal abnormalities: R19.5

## 2023-11-08 HISTORY — DX: Other specified postprocedural states: Z98.890

## 2023-11-08 NOTE — Patient Instructions (Signed)
 Your procedure is scheduled on:11-12-23 Tuesday Report to the Registration Desk on the 1st floor of the [facility].Then proceed to the 2nd floor Surgery Desk To find out your arrival time, please call [phone] between 1PM - 3PM on:11-11-23 Monday If your arrival time is 6:00 am, do not arrive before that time as the [facility] entrance doors do not open until 6:00 am.  REMEMBER: Instructions that are not followed completely may result in serious medical risk, up to and including death; or upon the discretion of your surgeon and anesthesiologist your surgery may need to be rescheduled.  Do not eat food after midnight the night before surgery.  No gum chewing or hard candies.  You may however, drink CLEAR liquids up to 2 hours before you are scheduled to arrive for your surgery. Do not drink anything within 2 hours of your scheduled arrival time.  Clear liquids include: - water  - apple juice without pulp - gatorade (not RED colors) - black coffee or tea (Do NOT add milk or creamers to the coffee or tea) Do NOT drink anything that is not on this list.  One week prior to surgery:Stop NOW (11-08-23) Stop Anti-inflammatories (NSAIDS) such as Advil, Aleve, Ibuprofen, Motrin, Naproxen, Naprosyn and Aspirin based products such as Excedrin, Goody's Powder, BC Powder. Stop ANY OVER THE COUNTER supplements until after surgery (Total Beets)  You may however, continue to take Tylenol  if needed for pain up until the day of surgery  Stop your 81 mg Aspirin NOW (11-08-23)  Continue taking all of your other prescription medications up until the day of surgery.  ON THE DAY OF SURGERY ONLY TAKE THESE MEDICATIONS WITH SIPS OF WATER: -famotidine (PEPCID)   Use your Albuterol  Inhaler the day of surgery and bring Inhaler to the hospital  No Alcohol for 24 hours before or after surgery.  No Smoking including e-cigarettes for 24 hours before surgery.  No chewable tobacco products for at least 6  hours before surgery.  No nicotine patches on the day of surgery.  Do not use any recreational drugs for at least a week (preferably 2 weeks) before your surgery.  Please be advised that the combination of cocaine and anesthesia may have negative outcomes, up to and including death. If you test positive for cocaine, your surgery will be cancelled.  On the morning of surgery brush your teeth with toothpaste and water, you may rinse your mouth with mouthwash if you wish. Do not swallow any toothpaste or mouthwash.  Use CHG Soap as directed on instruction sheet.  Do not wear jewelry, make-up, hairpins, clips or nail polish.  For welded (permanent) jewelry: bracelets, anklets, waist bands, etc.  Please have this removed prior to surgery.  If it is not removed, there is a chance that hospital personnel will need to cut it off on the day of surgery.  Do not wear lotions, powders, or perfumes.   Do not shave body hair from the neck down 48 hours before surgery.  Contact lenses, hearing aids and dentures may not be worn into surgery.  Do not bring valuables to the hospital. [facility] is not responsible for any missing/lost belongings or valuables.   Notify your doctor if there is any change in your medical condition (cold, fever, infection).  Wear comfortable clothing (specific to your surgery type) to the hospital.  After surgery, you can help prevent lung complications by doing breathing exercises.  Take deep breaths and cough every 1-2 hours. Your doctor  may order a device called an Incentive Spirometer to help you take deep breaths. When coughing or sneezing, hold a pillow firmly against your incision with both hands. This is called "splinting." Doing this helps protect your incision. It also decreases belly discomfort.  If you are being admitted to the hospital overnight, leave your suitcase in the car. After surgery it may be brought to your room.  In case of increased patient  census, it may be necessary for you, the patient, to continue your postoperative care in the Same Day Surgery department.  If you are being discharged the day of surgery, you will not be allowed to drive home. You will need a responsible individual to drive you home and stay with you for 24 hours after surgery.   If you are taking public transportation, you will need to have a responsible individual with you.  Please call the Pre-admissions Testing Dept. at [phone] if you have any questions about these instructions.  Surgery Visitation Policy:  Patients having surgery or a procedure may have two visitors.  Children under the age of 101 must have an adult with them who is not the patient.  Temporary Visitor Restrictions Due to increasing cases of flu, RSV and COVID-19: Children ages 28 and under will not be able to visit patients in [facility] hospitals under most circumstances.     Preparing for Surgery with CHLORHEXIDINE  GLUCONATE (CHG) Soap  Chlorhexidine  Gluconate (CHG) Soap  o An antiseptic cleaner that kills germs and bonds with the skin to continue killing germs even after washing  o Used for showering the night before surgery and morning of surgery  Before surgery, you can play an important role by reducing the number of germs on your skin.  CHG (Chlorhexidine  gluconate) soap is an antiseptic cleanser which kills germs and bonds with the skin to continue killing germs even after washing.  Please do not use if you have an allergy to CHG or antibacterial soaps. If your skin becomes reddened/irritated stop using the CHG.  1. Shower the NIGHT BEFORE SURGERY and the MORNING OF SURGERY with CHG soap.  2. If you choose to wash your hair, wash your hair first as usual with your normal shampoo.  3. After shampooing, rinse your hair and body thoroughly to remove the shampoo.  4. Use CHG as you would any other liquid soap. You can apply CHG directly to the skin and wash  gently with a scrungie or a clean washcloth.  5. Apply the CHG soap to your body only from the neck down. Do not use on open wounds or open sores. Avoid contact with your eyes, ears, mouth, and genitals (private parts). Wash face and genitals (private parts) with your normal soap.  6. Wash thoroughly, paying special attention to the area where your surgery will be performed.  7. Thoroughly rinse your body with warm water.  8. Do not shower/wash with your normal soap after using and rinsing off the CHG soap.  9. Pat yourself dry with a clean towel.  10. Wear clean pajamas to bed the night before surgery.  12. Place clean sheets on your bed the night of your first shower and do not sleep with pets.  13. Shower again with the CHG soap on the day of surgery prior to arriving at the hospital.  14. Do not apply any deodorants/lotions/powders.  15. Please wear clean clothes to the hospital.

## 2023-11-11 ENCOUNTER — Encounter
Admission: RE | Admit: 2023-11-11 | Discharge: 2023-11-11 | Discharge status: Home / Self Care | Payer: Medicare HMO | Source: Ambulatory Visit | Attending: Unknown Physician Specialty | Admitting: Unknown Physician Specialty

## 2023-11-11 DIAGNOSIS — J449 Chronic obstructive pulmonary disease, unspecified: Secondary | ICD-10-CM | POA: Insufficient documentation

## 2023-11-11 DIAGNOSIS — Z01810 Encounter for preprocedural cardiovascular examination: Secondary | ICD-10-CM

## 2023-11-11 DIAGNOSIS — Z01818 Encounter for other preprocedural examination: Secondary | ICD-10-CM | POA: Diagnosis present

## 2023-11-11 DIAGNOSIS — Z01812 Encounter for preprocedural laboratory examination: Secondary | ICD-10-CM

## 2023-11-11 LAB — BASIC METABOLIC PANEL
Anion gap: 12 (ref 5–15)
BUN: 11 mg/dL (ref 8–23)
CO2: 25 mmol/L (ref 22–32)
Calcium: 8.8 mg/dL — ABNORMAL LOW (ref 8.9–10.3)
Chloride: 101 mmol/L (ref 98–111)
Creatinine, Ser: 0.67 mg/dL (ref 0.44–1.00)
GFR, Estimated: >60 mL/min (ref >60)
Glucose, Bld: 93 mg/dL (ref 70–99)
Potassium: 3.6 mmol/L (ref 3.5–5.1)
Sodium: 138 mmol/L (ref 135–145)

## 2023-11-11 LAB — CBC
HCT: 39.9 % (ref 36.0–46.0)
Hemoglobin: 13 g/dL (ref 12.0–15.0)
MCH: 31.1 pg (ref 26.0–34.0)
MCHC: 32.6 g/dL (ref 30.0–36.0)
MCV: 95.5 fL (ref 80.0–100.0)
Platelets: 242 10*3/uL (ref 150–400)
RBC: 4.18 MIL/uL (ref 3.87–5.11)
RDW: 14 % (ref 11.5–15.5)
WBC: 3.7 10*3/uL — ABNORMAL LOW (ref 4.0–10.5)
nRBC: 0.5 % — ABNORMAL HIGH (ref 0.0–0.2)

## 2023-11-12 ENCOUNTER — Ambulatory Visit: Payer: Self-pay | Admitting: Urgent Care

## 2023-11-12 ENCOUNTER — Encounter
Admission: RE | Disposition: A | Discharge status: Home / Self Care | Payer: Self-pay | Source: Home / Self Care | Attending: Unknown Physician Specialty

## 2023-11-12 ENCOUNTER — Other Ambulatory Visit: Payer: Self-pay

## 2023-11-12 ENCOUNTER — Ambulatory Visit
Admission: RE | Admit: 2023-11-12 | Discharge: 2023-11-12 | Disposition: A | Discharge status: Home / Self Care | Payer: Medicare HMO | Attending: Unknown Physician Specialty | Admitting: Unknown Physician Specialty

## 2023-11-12 ENCOUNTER — Ambulatory Visit: Payer: Self-pay

## 2023-11-12 ENCOUNTER — Encounter: Payer: Self-pay | Admitting: Unknown Physician Specialty

## 2023-11-12 DIAGNOSIS — Z87891 Personal history of nicotine dependence: Secondary | ICD-10-CM | POA: Diagnosis not present

## 2023-11-12 DIAGNOSIS — J449 Chronic obstructive pulmonary disease, unspecified: Secondary | ICD-10-CM | POA: Insufficient documentation

## 2023-11-12 DIAGNOSIS — D649 Anemia, unspecified: Secondary | ICD-10-CM | POA: Insufficient documentation

## 2023-11-12 DIAGNOSIS — E042 Nontoxic multinodular goiter: Secondary | ICD-10-CM | POA: Insufficient documentation

## 2023-11-12 DIAGNOSIS — K219 Gastro-esophageal reflux disease without esophagitis: Secondary | ICD-10-CM | POA: Diagnosis not present

## 2023-11-12 HISTORY — PX: THYROIDECTOMY: SHX17

## 2023-11-12 HISTORY — PX: CONTINUOUS NERVE MONITORING: SHX6650

## 2023-11-12 SURGERY — THYROIDECTOMY
Anesthesia: General

## 2023-11-12 MED ORDER — LIDOCAINE-EPINEPHRINE 1 %-1:100000 IJ SOLN
INTRAMUSCULAR | Status: DC | PRN
Start: 2023-11-12 — End: 2023-11-12
  Administered 2023-11-12: 3 mL

## 2023-11-12 MED ORDER — DROPERIDOL 2.5 MG/ML IJ SOLN: 0.6250 mg | Freq: Once | INTRAMUSCULAR | Status: DC | PRN

## 2023-11-12 MED ORDER — LIDOCAINE HCL (PF) 2 % IJ SOLN
INTRAMUSCULAR | Status: AC
  Filled 2023-11-12: qty 5

## 2023-11-12 MED ORDER — ACETAMINOPHEN 10 MG/ML IV SOLN
INTRAVENOUS | Status: DC | PRN
  Administered 2023-11-12: 1000 mg via INTRAVENOUS

## 2023-11-12 MED ORDER — PHENYLEPHRINE 80 MCG/ML (10ML) SYRINGE FOR IV PUSH (FOR BLOOD PRESSURE SUPPORT)
PREFILLED_SYRINGE | INTRAVENOUS | Status: DC | PRN
Start: 2023-11-12 — End: 2023-11-12
  Administered 2023-11-12: 160 ug via INTRAVENOUS
  Administered 2023-11-12: 80 ug via INTRAVENOUS

## 2023-11-12 MED ORDER — LIDOCAINE-EPINEPHRINE 1 %-1:100000 IJ SOLN
INTRAMUSCULAR | Status: AC
  Filled 2023-11-12: qty 1

## 2023-11-12 MED ORDER — 0.9 % SODIUM CHLORIDE (POUR BTL) OPTIME
TOPICAL | Status: DC | PRN
  Administered 2023-11-12: 500 mL

## 2023-11-12 MED ORDER — EPHEDRINE 5 MG/ML INJ
INTRAVENOUS | Status: AC
  Filled 2023-11-12: qty 5

## 2023-11-12 MED ORDER — PROPOFOL 10 MG/ML IV BOLUS
INTRAVENOUS | Status: AC
  Filled 2023-11-12: qty 40

## 2023-11-12 MED ORDER — SUCCINYLCHOLINE CHLORIDE 200 MG/10ML IV SOSY
PREFILLED_SYRINGE | INTRAVENOUS | Status: DC | PRN
  Administered 2023-11-12: 100 mg via INTRAVENOUS

## 2023-11-12 MED ORDER — SODIUM CHLORIDE 0.9 % IV SOLN
INTRAVENOUS | Status: DC | PRN
  Administered 2023-11-12: .1 ug/kg/min via INTRAVENOUS

## 2023-11-12 MED ORDER — PHENYLEPHRINE HCL-NACL 20-0.9 MG/250ML-% IV SOLN
INTRAVENOUS | Status: DC | PRN
  Administered 2023-11-12: 50 ug/min via INTRAVENOUS

## 2023-11-12 MED ORDER — CHLORHEXIDINE GLUCONATE 0.12 % MT SOLN
15.0000 mL | Freq: Once | OROMUCOSAL | Status: AC
  Administered 2023-11-12: 15 mL via OROMUCOSAL

## 2023-11-12 MED ORDER — ORAL CARE MOUTH RINSE: 15.0000 mL | Freq: Once | OROMUCOSAL | Status: AC

## 2023-11-12 MED ORDER — MIDAZOLAM HCL 2 MG/2ML IJ SOLN
INTRAMUSCULAR | Status: DC | PRN
  Administered 2023-11-12 (×2): 1 mg via INTRAVENOUS

## 2023-11-12 MED ORDER — FENTANYL CITRATE (PF) 100 MCG/2ML IJ SOLN: 25.0000 ug | INTRAMUSCULAR | Status: DC | PRN

## 2023-11-12 MED ORDER — DEXAMETHASONE SODIUM PHOSPHATE 10 MG/ML IJ SOLN
INTRAMUSCULAR | Status: DC | PRN
Start: 2023-11-12 — End: 2023-11-12
  Administered 2023-11-12: 10 mg via INTRAVENOUS

## 2023-11-12 MED ORDER — LIDOCAINE HCL (CARDIAC) PF 100 MG/5ML IV SOSY
PREFILLED_SYRINGE | INTRAVENOUS | Status: DC | PRN
  Administered 2023-11-12: 60 mg via INTRAVENOUS

## 2023-11-12 MED ORDER — GLYCOPYRROLATE 0.2 MG/ML IJ SOLN
INTRAMUSCULAR | Status: DC | PRN
Start: 2023-11-12 — End: 2023-11-12
  Administered 2023-11-12: .2 mg via INTRAVENOUS

## 2023-11-12 MED ORDER — DEXMEDETOMIDINE HCL IN NACL 80 MCG/20ML IV SOLN
INTRAVENOUS | Status: AC
  Filled 2023-11-12: qty 20

## 2023-11-12 MED ORDER — SUCCINYLCHOLINE CHLORIDE 200 MG/10ML IV SOSY
PREFILLED_SYRINGE | INTRAVENOUS | Status: AC
  Filled 2023-11-12: qty 10

## 2023-11-12 MED ORDER — OXYCODONE-ACETAMINOPHEN 5-325 MG PO TABS: 1.0000 | ORAL_TABLET | ORAL | 0 refills | Status: AC | PRN

## 2023-11-12 MED ORDER — PROPOFOL 1000 MG/100ML IV EMUL
INTRAVENOUS | Status: AC
  Filled 2023-11-12: qty 100

## 2023-11-12 MED ORDER — PROPOFOL 500 MG/50ML IV EMUL
INTRAVENOUS | Status: DC | PRN
  Administered 2023-11-12: 100 ug/kg/min via INTRAVENOUS

## 2023-11-12 MED ORDER — OXYCODONE HCL 5 MG PO TABS
ORAL_TABLET | ORAL | Status: AC
Start: 2023-11-12 — End: ?
  Filled 2023-11-12: qty 1

## 2023-11-12 MED ORDER — CHLORHEXIDINE GLUCONATE 0.12 % MT SOLN
OROMUCOSAL | Status: AC
  Filled 2023-11-12: qty 15

## 2023-11-12 MED ORDER — ONDANSETRON HCL 4 MG/2ML IJ SOLN
INTRAMUSCULAR | Status: AC
  Filled 2023-11-12: qty 2

## 2023-11-12 MED ORDER — MIDAZOLAM HCL 2 MG/2ML IJ SOLN
INTRAMUSCULAR | Status: AC
  Filled 2023-11-12: qty 2

## 2023-11-12 MED ORDER — OXYCODONE HCL 5 MG PO TABS
5.0000 mg | ORAL_TABLET | Freq: Once | ORAL | Status: AC
  Administered 2023-11-12: 5 mg via ORAL

## 2023-11-12 MED ORDER — DEXAMETHASONE SODIUM PHOSPHATE 10 MG/ML IJ SOLN
INTRAMUSCULAR | Status: AC
  Filled 2023-11-12: qty 1

## 2023-11-12 MED ORDER — ACETAMINOPHEN 10 MG/ML IV SOLN
INTRAVENOUS | Status: AC
  Filled 2023-11-12: qty 100

## 2023-11-12 MED ORDER — FENTANYL CITRATE (PF) 100 MCG/2ML IJ SOLN
INTRAMUSCULAR | Status: AC
  Filled 2023-11-12: qty 2

## 2023-11-12 MED ORDER — PROPOFOL 10 MG/ML IV BOLUS
INTRAVENOUS | Status: DC | PRN
  Administered 2023-11-12: 100 mg via INTRAVENOUS

## 2023-11-12 MED ORDER — FENTANYL CITRATE (PF) 100 MCG/2ML IJ SOLN
INTRAMUSCULAR | Status: DC | PRN
  Administered 2023-11-12: 50 ug via INTRAVENOUS

## 2023-11-12 MED ORDER — PHENYLEPHRINE HCL-NACL 20-0.9 MG/250ML-% IV SOLN
INTRAVENOUS | Status: AC
  Filled 2023-11-12: qty 250

## 2023-11-12 MED ORDER — REMIFENTANIL HCL 1 MG IV SOLR
INTRAVENOUS | Status: AC
  Filled 2023-11-12: qty 1000

## 2023-11-12 MED ORDER — LACTATED RINGERS IV SOLN: INTRAVENOUS | Status: DC

## 2023-11-12 MED ORDER — ONDANSETRON HCL 4 MG/2ML IJ SOLN
INTRAMUSCULAR | Status: DC | PRN
  Administered 2023-11-12: 4 mg via INTRAVENOUS

## 2023-11-12 MED ORDER — EPHEDRINE SULFATE-NACL 50-0.9 MG/10ML-% IV SOSY
PREFILLED_SYRINGE | INTRAVENOUS | Status: DC | PRN
  Administered 2023-11-12: 5 mg via INTRAVENOUS

## 2023-11-12 SURGICAL SUPPLY — 37 items
ATTRACTOMAT 16X20 MAGNETIC DRP (DRAPES) ×2 IMPLANT
BLADE SURG 15 STRL LF DISP TIS (BLADE) ×2 IMPLANT
CORD BIP STRL DISP 12FT (MISCELLANEOUS) ×2 IMPLANT
DERMABOND ADVANCED .7 DNX12 (GAUZE/BANDAGES/DRESSINGS) ×2 IMPLANT
DRAIN WOUND RND W/TROCAR (DRAIN) IMPLANT
DRSG TEGADERM 2-3/8X2-3/4 SM (GAUZE/BANDAGES/DRESSINGS) ×2 IMPLANT
ELECT CAUTERY BLADE TIP 2.5 (TIP) ×2 IMPLANT
ELECT LARYNGEAL DUAL CHAN (ELECTRODE) ×2 IMPLANT
ELECT NEEDLE 20X.3 GREEN (MISCELLANEOUS) IMPLANT
ELECT REM PT RETURN 9FT ADLT (ELECTROSURGICAL) ×2 IMPLANT
ELECTRODE CAUTERY BLDE TIP 2.5 (TIP) ×2 IMPLANT
ELECTRODE NDL 20X.3 GREEN (MISCELLANEOUS) IMPLANT
ELECTRODE REM PT RTRN 9FT ADLT (ELECTROSURGICAL) ×2 IMPLANT
EVACUATOR SILICONE 100CC (DRAIN) IMPLANT
FORCEPS JEWEL BIP 4-3/4 STR (INSTRUMENTS) ×2 IMPLANT
GAUZE 4X4 16PLY ~~LOC~~+RFID DBL (SPONGE) ×4 IMPLANT
GLOVE BIO SURGEON STRL SZ7.5 (GLOVE) ×4 IMPLANT
GOWN STRL REUS W/ TWL LRG LVL3 (GOWN DISPOSABLE) ×6 IMPLANT
HEMOSTAT SURGICEL 2X3 (HEMOSTASIS) ×2 IMPLANT
HOLSTER ELECTROSUGICAL PENCIL (MISCELLANEOUS) ×2 IMPLANT
HOOK STAY BLUNT/RETRACTOR 5M (MISCELLANEOUS) IMPLANT
KIT TURNOVER KIT A (KITS) ×2 IMPLANT
LABEL OR SOLS (LABEL) ×2 IMPLANT
MANIFOLD NEPTUNE II (INSTRUMENTS) ×2 IMPLANT
NS IRRIG 500ML POUR BTL (IV SOLUTION) ×2 IMPLANT
PACK HEAD/NECK (MISCELLANEOUS) ×2 IMPLANT
PROBE NEUROSIGN BIPOL (MISCELLANEOUS) ×2 IMPLANT
SHEARS HARMONIC 9CM CVD (BLADE) ×2 IMPLANT
SOL PREP PVP 2OZ (MISCELLANEOUS) ×2 IMPLANT
SOLUTION PREP PVP 2OZ (MISCELLANEOUS) ×2 IMPLANT
SPONGE KITTNER 5P (MISCELLANEOUS) ×2 IMPLANT
STAPLER SKIN PROX 35W (STAPLE) ×2 IMPLANT
SUT SILK 2 0 SH (SUTURE) ×2 IMPLANT
SUT SILK 2-0 18XBRD TIE 12 (SUTURE) ×2 IMPLANT
SUT VIC AB 4-0 RB1 18 (SUTURE) ×4 IMPLANT
TRAP FLUID SMOKE EVACUATOR (MISCELLANEOUS) ×2 IMPLANT
WATER STERILE IRR 500ML POUR (IV SOLUTION) ×2 IMPLANT

## 2023-11-12 NOTE — H&P (Signed)
The patient's history has been reviewed, patient examined, no change in status, stable for surgery.  Questions were answered to the patients satisfaction.

## 2023-11-12 NOTE — Transfer of Care (Signed)
Immediate Anesthesia Transfer of Care Note  Patient: Adriana Howard  Procedure(s) Performed: HEMI- THYROIDECTOMY (Left) LARYNGEAL CONTINUOUS NERVE MONITORING  Patient Location: PACU  Anesthesia Type:General  Level of Consciousness: drowsy and patient cooperative  Airway & Oxygen Therapy: Patient Spontanous Breathing  Post-op Assessment: Report given to RN, Post -op Vital signs reviewed and stable, and Patient moving all extremities X 4  Post vital signs: Reviewed and stable  Last Vitals:  Vitals Value Taken Time  BP 110/70 11/12/23 0834  Temp    Pulse 102 11/12/23 0838  Resp 14 11/12/23 0838  SpO2 100 % 11/12/23 0838  Vitals shown include unfiled device data.  Last Pain:  Vitals:   11/12/23 0623  TempSrc: Oral  PainSc: 0-No pain         Complications: No notable events documented.

## 2023-11-12 NOTE — Anesthesia Procedure Notes (Cosign Needed)
Procedure Name: Intubation Date/Time: 11/12/2023 7:36 AM  Performed by: Pricilla Handler, RNPre-anesthesia Checklist: Patient identified, Emergency Drugs available, Suction available and Patient being monitored Patient Re-evaluated:Patient Re-evaluated prior to induction Oxygen Delivery Method: Circle system utilized Preoxygenation: Pre-oxygenation with 100% oxygen Induction Type: IV induction Ventilation: Mask ventilation without difficulty Laryngoscope Size: Mac, Glidescope and 3 Grade View: Grade I Tube type: Oral Tube size: 7.0 mm Number of attempts: 1 Airway Equipment and Method: Stylet and Oral airway Placement Confirmation: ETT inserted through vocal cords under direct vision, positive ETCO2 and breath sounds checked- equal and bilateral Secured at: 20 cm Tube secured with: Tape Dental Injury: Teeth and Oropharynx as per pre-operative assessment

## 2023-11-12 NOTE — Op Note (Signed)
11/12/2023  8:33 AM    Adriana Howard  [id number]   Pre-Op Dx: Thyroid nodule  Post-op Dx: SAME  Proc: Left hemithyroidectomy; recurrent laryngeal nerve monitoring 1 hour  Surg:  Davina Poke  Assistant: Vought  Anes:  GOT  EBL: Less than 10 cc  Comp: None  Findings: Firm nodule in the midportion of the left thyroid lobe  Procedure: Carrye was in parental near take the operating placed in supine position.  After general endotracheal anesthesia with a laryngeal monitoring device, the neck was gently extended.  The recurrent laryngeal nerve was monitored throughout the procedure.  The landmarks were identified and incision line was marked just beneath the cricoid cartilage.  A local anesthetic of 1% lidocaine with 1 100,000 units of epinephrine was used to inject along the incision line a total of 3 cc was used.  With the neck prepped and draped sterilely a 15 blade was used to incise down to and through the platysma muscle.  Hemostasis achieved using the Bovie cautery.  The midline raphae was identified and gently divided using the harmonic scalpel.  The left lobe of the thyroid was identified.  The strap muscles were retracted laterally any small bleeding vessels were divided using the harmonic scalpel.  The superior pole was isolated and the vessels of divided using the harmonic scalpel.  This allowed the gland to gently medialized.  The superior and inferior parathyroid glands were identified and left intact on their vascular pedicles.  The recurrent laryngeal nerve was identified in the tracheoesophageal groove.  This was stimulated.  This was traced superiorly as it entered the larynx just below the cricothyroid muscle.  The gland was then retracted medially and Berry's ligament was released using the microbipolar.  There was an obvious nodule which was palpated in the medial portion of the gland.  The gland was then peeled over the anterior wall of the trachea and the isthmus  was divided using the harmonic scalpel.  A stitch was placed in the upper pole on the left for identification.  The wound was then copiously irrigated with saline and any small bleeding points were cauterized using the microbipolar.  The recurrent laryngeal nerve was stimulated there were end of the case and was intact.  The parathyroid glands also appear to be intact.  With no active bleeding Surgicel was placed in the wound.  The strap muscles were reapproximated using 4-0 Vicryl with the platysma layer was closed using 4-0 Vicryl the subcutaneous tissues closed using 4-0 Vicryl and the skin was closed using Dermabond.  With this completed the patient was then returned to anesthesia where she was awakened in the operating type cart in stable condition.  Specimen: Left hemithyroid  Dispo:   Good  Plan: Discharge to home follow-up 1 week  Davina Poke  11/12/2023 8:33 AM

## 2023-11-12 NOTE — Anesthesia Preprocedure Evaluation (Signed)
Anesthesia Evaluation  Patient identified by MRN, date of birth, ID band Patient awake    Reviewed: Allergy & Precautions, NPO status , Patient's Chart, lab work & pertinent test results, reviewed documented beta blocker date and time   History of Anesthesia Complications (+) PONV and history of anesthetic complications  Airway Mallampati: III  TM Distance: <3 FB Neck ROM: limited    Dental  (+) Chipped, Dental Advidsory Given   Pulmonary neg shortness of breath, COPD,  COPD inhaler, neg recent URI, former smoker          Cardiovascular Exercise Tolerance: Good negative cardio ROS      Neuro/Psych neg Seizures  Neuromuscular disease  negative psych ROS   GI/Hepatic Neg liver ROS,GERD  Controlled,,  Endo/Other  negative endocrine ROS    Renal/GU negative Renal ROS     Musculoskeletal   Abdominal   Peds  Hematology  (+) Blood dyscrasia, anemia   Anesthesia Other Findings Past Medical History:   GERD (gastroesophageal reflux disease)                       Overactive bladder                                           IBS (irritable bowel syndrome)                               Anemia                                                         Comment: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   Reproductive/Obstetrics                             Anesthesia Physical Anesthesia Plan  ASA: 2  Anesthesia Plan: General   Post-op Pain Management:    Induction: Intravenous  PONV Risk Score and Plan: 4 or greater and Treatment may vary due to age or medical condition, Propofol infusion and TIVA  Airway Management Planned: Oral ETT  Additional Equipment:   Intra-op Plan:   Post-operative Plan: Extubation in OR  Informed Consent: I have reviewed the patients History and Physical, chart, labs and discussed the procedure including the risks, benefits and  alternatives for the proposed anesthesia with the patient or authorized representative who has indicated his/her understanding and acceptance.       Plan Discussed with: CRNA  Anesthesia Plan Comments:         Anesthesia Quick Evaluation

## 2023-11-13 ENCOUNTER — Encounter: Payer: Self-pay | Admitting: Unknown Physician Specialty

## 2023-11-13 NOTE — Anesthesia Postprocedure Evaluation (Signed)
Anesthesia Post Note  Patient: Adriana Howard  Procedure(s) Performed: HEMI- THYROIDECTOMY (Left) LARYNGEAL CONTINUOUS NERVE MONITORING  Patient location during evaluation: PACU Anesthesia Type: General Level of consciousness: awake and alert Pain management: pain level controlled Vital Signs Assessment: post-procedure vital signs reviewed and stable Respiratory status: spontaneous breathing, nonlabored ventilation, respiratory function stable and patient connected to nasal cannula oxygen Cardiovascular status: blood pressure returned to baseline and stable Postop Assessment: no apparent nausea or vomiting Anesthetic complications: no   No notable events documented.   Last Vitals:  Vitals:   11/12/23 0900 11/12/23 0911  BP: 122/77 126/86  Pulse: 97 (!) 111  Resp: 15 16  Temp: 36.8 C (!) 36.3 C  SpO2: 95% 96%    Last Pain:  Vitals:   11/13/23 0834  TempSrc:   PainSc: 0-No pain                 Lenard Simmer

## 2023-11-14 LAB — SURGICAL PATHOLOGY

## 2024-07-28 ENCOUNTER — Other Ambulatory Visit: Payer: Self-pay | Admitting: Family Medicine

## 2024-07-28 DIAGNOSIS — Z1231 Encounter for screening mammogram for malignant neoplasm of breast: Secondary | ICD-10-CM
